# Patient Record
Sex: Female | Born: 2000 | Smoking: Never smoker
Health system: Southern US, Community
[De-identification: ages and names within clinical notes are randomized; demographics above are authoritative.]

## PROBLEM LIST (undated history)

## (undated) DIAGNOSIS — N946 Dysmenorrhea, unspecified: Secondary | ICD-10-CM

## (undated) DIAGNOSIS — J3089 Other allergic rhinitis: Secondary | ICD-10-CM

## (undated) HISTORY — DX: Dysmenorrhea, unspecified: N94.6

## (undated) HISTORY — DX: Other allergic rhinitis: J30.89

## (undated) HISTORY — PX: WISDOM TOOTH EXTRACTION: SHX21

---
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
## 2020-01-04 ENCOUNTER — Other Ambulatory Visit: Payer: Self-pay

## 2020-01-04 ENCOUNTER — Telehealth (HOSPITAL_COMMUNITY): Payer: Self-pay | Admitting: Licensed Clinical Social Worker

## 2020-01-04 ENCOUNTER — Ambulatory Visit (HOSPITAL_COMMUNITY): Payer: Medicaid Other | Admitting: Licensed Clinical Social Worker

## 2020-01-04 NOTE — Telephone Encounter (Signed)
Pt did not present for her virtual therapy session. Called and left vm for patient to join session. Pt did not join. Ana Hunter, LCAS

## 2020-01-13 ENCOUNTER — Other Ambulatory Visit: Payer: Self-pay

## 2020-01-13 ENCOUNTER — Telehealth (INDEPENDENT_AMBULATORY_CARE_PROVIDER_SITE_OTHER): Payer: Medicaid Other | Admitting: Psychiatry

## 2020-01-13 DIAGNOSIS — F3341 Major depressive disorder, recurrent, in partial remission: Secondary | ICD-10-CM | POA: Diagnosis not present

## 2020-01-13 DIAGNOSIS — F411 Generalized anxiety disorder: Secondary | ICD-10-CM

## 2020-01-13 DIAGNOSIS — F909 Attention-deficit hyperactivity disorder, unspecified type: Secondary | ICD-10-CM | POA: Insufficient documentation

## 2020-01-13 DIAGNOSIS — F410 Panic disorder [episodic paroxysmal anxiety] without agoraphobia: Secondary | ICD-10-CM | POA: Diagnosis not present

## 2020-01-13 MED ORDER — MIRTAZAPINE 15 MG PO TABS: 15.0000 mg | ORAL_TABLET | Freq: Every day | ORAL | 2 refills | Status: DC

## 2020-01-13 MED ORDER — DIAZEPAM 5 MG PO TABS: 5.0000 mg | ORAL_TABLET | Freq: Two times a day (BID) | ORAL | 1 refills | Status: DC | PRN

## 2020-01-13 MED ORDER — AMPHETAMINE-DEXTROAMPHET ER 20 MG PO CP24
20.0000 mg | ORAL_CAPSULE | ORAL | 0 refills | Status: DC
Start: 2020-03-13 — End: 2020-06-06

## 2020-01-13 MED ORDER — AMPHETAMINE-DEXTROAMPHET ER 20 MG PO CP24
20.0000 mg | ORAL_CAPSULE | ORAL | 0 refills | Status: DC
Start: 2020-01-13 — End: 2020-04-11

## 2020-01-13 MED ORDER — AMPHETAMINE-DEXTROAMPHET ER 20 MG PO CP24
20.0000 mg | ORAL_CAPSULE | ORAL | 0 refills | Status: DC
Start: 2020-02-12 — End: 2020-04-11

## 2020-01-13 MED ORDER — BUPROPION HCL ER (SR) 100 MG PO TB12: 100.0000 mg | ORAL_TABLET | Freq: Two times a day (BID) | ORAL | 0 refills | Status: DC

## 2020-01-13 NOTE — Progress Notes (Signed)
BH MD/PA/NP OP Progress Note  01/13/2020 10:42 AM Kaci Leylah Tarnow  MRN:  [id number] Interview was conducted by phone and I verified that I was speaking with the correct person using two identifiers. I discussed the limitations of evaluation and management by telemedicine and  the availability of in person appointments. Patient expressed understanding and agreed to proceed. Patient location - home; physician - home office.  Chief Complaint: Lack of focus, forgetfulness, morning sedation.  HPI: 19yo single female referred to our practice by her PCP for treatment of depression and anxiety. Arnette has a hx od depressed mood and excessive worrying. In the past she engaged in SIB by cutting to deal with high level of stress. She denies having any hx of suicidal thoughts however. She has not engaged in SIB for several monthsnow. 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 She has been clean for past 6 months. Ekaterini was in counseling at [facility] in the past. She has never been on any psychotropic medications for depression/anxiety.We attempted to start fluoxetine but she could not tolerate nausea and vomiting that this medication caused. Similarly trazodone prescribed for insomnia also made her feel nauseated and did not help with sleep.We have started mirtazapine and she tolerates it much better, less depressed, better sleep (she feels it is "too good" since dose was increased to 30 mg). Anxiety has not decreased much and she was panic like symptoms almost daily. We added Ambien prn sleep but it is not covered by her insurance.Lorazepam was not helpful for anxiety but diazepam is.She has been c/o daytime fatigue, lack of focus, forgetfulness.These symptoms are not new but seemed to have worsened in the past few years. We added Wellbutrin SR now 200 mg bid and while she tolerates it well it is of not much help. Her sister has ADD and is prescribed  Adderall xr 25 mg. Zita took a lower dose once and found it helpful.   Visit Diagnosis:    ICD-10-CM   1. Adult ADHD  F90.9   2. GAD (generalized anxiety disorder)  F41.1   3. Major depressive disorder, recurrent episode, in partial remission (HCC)  F33.41   4. Panic disorder  F41.0     Past Psychiatric History: Please see intake H&P.  Past Medical History:  Past Medical History:  Diagnosis Date  . Environmental and seasonal allergies   . Painful menstrual periods     Past Surgical History:  Procedure Laterality Date  . WISDOM TOOTH EXTRACTION      Family Psychiatric History: Reviewed.  Family History:  Family History  Problem Relation Age of Onset  . Cancer Maternal Grandmother        lung  . Alcohol abuse Father   . Alcohol abuse Paternal Grandfather   . ADD / ADHD Sister     Social History:  Social History   Socioeconomic History  . Marital status: Single    Spouse name: Not on file  . Number of children: 0  . Years of education: Not on file  . Highest education level: Not on file  Occupational History  . Occupation: Consulting civil engineer  Tobacco Use  . Smoking status: Never Smoker  . Smokeless tobacco: Never Used  Vaping Use  . Vaping Use: Some days  Substance and Sexual Activity  . Alcohol use: Never  . Drug use: Not Currently    Types: Marijuana    Comment: daily for 2 years until about 6  months ago  . Sexual activity: Never  Other Topics Concern  . Not on file  Social History Narrative  . Not on file   Social Determinants of Health   Financial Resource Strain:   . Difficulty of Paying Living Expenses: Not on file  Food Insecurity:   . Worried About Programme researcher, broadcasting/film/video in the Last Year: Not on file  . Ran Out of Food in the Last Year: Not on file  Transportation Needs:   . Lack of Transportation (Medical): Not on file  . Lack of Transportation (Non-Medical): Not on file  Physical Activity:   . Days of Exercise per Week: Not on file  . Minutes of  Exercise per Session: Not on file  Stress:   . Feeling of Stress : Not on file  Social Connections:   . Frequency of Communication with Friends and Family: Not on file  . Frequency of Social Gatherings with Friends and Family: Not on file  . Attends Religious Services: Not on file  . Active Member of Clubs or Organizations: Not on file  . Attends Banker Meetings: Not on file  . Marital Status: Not on file    Allergies: No Known Allergies  Metabolic Disorder Labs: Lab Results  Component Value Date   HGBA1C 5.0 08/02/2019   MPG 96.8 08/02/2019   MPG 102.54 12/02/2017   No results found for: PROLACTIN Lab Results  Component Value Date   CHOL 177 (H) 08/02/2019   TRIG 64 08/02/2019   HDL 72 08/02/2019   CHOLHDL 2.5 08/02/2019   VLDL 13 08/02/2019   LDLCALC 92 08/02/2019   LDLCALC 79 12/02/2017   Lab Results  Component Value Date   TSH 0.501 12/02/2017    Therapeutic Level Labs: No results found for: LITHIUM No results found for: VALPROATE No components found for:  CBMZ  Current Medications: Current Outpatient Medications  Medication Sig Dispense Refill  . amphetamine-dextroamphetamine (ADDERALL XR) 20 MG 24 hr capsule Take 1 capsule (20 mg total) by mouth every morning. 30 capsule 0  . [START ON 02/12/2020] amphetamine-dextroamphetamine (ADDERALL XR) 20 MG 24 hr capsule Take 1 capsule (20 mg total) by mouth every morning. 30 capsule 0  . [START ON 03/13/2020] amphetamine-dextroamphetamine (ADDERALL XR) 20 MG 24 hr capsule Take 1 capsule (20 mg total) by mouth every morning. 30 capsule 0  . buPROPion (WELLBUTRIN SR) 100 MG 12 hr tablet Take 1 tablet (100 mg total) by mouth 2 (two) times daily for 10 days. One tablet in AM, one around 3 PM 20 tablet 0  . diazepam (VALIUM) 5 MG tablet Take 1 tablet (5 mg total) by mouth 2 (two) times daily as needed for anxiety. 60 tablet 1  . mirtazapine (REMERON) 15 MG tablet Take 1 tablet (15 mg total) by mouth at bedtime.  30 tablet 2   No current facility-administered medications for this visit.      Psychiatric Specialty Exam: Review of Systems  Constitutional: Positive for fatigue.  Psychiatric/Behavioral: Positive for decreased concentration. The patient is nervous/anxious.   All other systems reviewed and are negative.   There were no vitals taken for this visit.There is no height or weight on file to calculate BMI.  General Appearance: NA  Eye Contact:  NA  Speech:  Clear and Coherent and Normal Rate  Volume:  Normal  Mood:  Anxious  Affect:  NA  Thought Process:  Goal Directed  Orientation:  Full (Time, Place, and Person)  Thought Content: Logical  Suicidal Thoughts:  No  Homicidal Thoughts:  No  Memory:  Immediate;   Fair Recent;   Fair Remote;   Good  Judgement:  Good  Insight:  Good  Psychomotor Activity:  NA  Concentration:  Concentration: Fair  Recall:  Fair  Fund of Knowledge: Good  Language: Good  Akathisia:  Negative  Handed:  Right  AIMS (if indicated): not done  Assets:  Communication Skills Desire for Improvement Housing Physical Health Talents/Skills  ADL's:  Intact  Cognition: WNL  Sleep:  Good    Assessment and Plan: 19yo single female referred to our practice by her PCP for treatment of depression and anxiety. Danayah has a hx od depressed mood and excessive worrying. In the past she engaged in SIB by cutting to deal with high level of stress. She denies having any hx of suicidal thoughts however. She has not engaged in SIB for several monthsnow. 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 She has been clean for past 6 months. Khianna was in counseling at [facility] in the past. She has never been on any psychotropic medications for depression/anxiety.We attempted to start fluoxetine but she could not tolerate nausea and vomiting that this medication caused. Similarly trazodone prescribed for insomnia also made  her feel nauseated and did not help with sleep.We have started mirtazapine and she tolerates it much better, less depressed, better sleep (she feels it is "too good" since dose was increased to 30 mg). Anxiety has not decreased much and she was panic like symptoms almost daily. We added Ambien prn sleep but it is not covered by her insurance.Lorazepam was not helpful for anxiety but diazepam is.She has been c/o daytime fatigue, lack of focus, forgetfulness.These symptoms are not new but seemed to have worsened in the past few years. We added Wellbutrin SR now 200 mg bid and while she tolerates it well it is of not much help. Her older sister has ADD and is prescribed Adderall xr 25 mg. Chasta took a lower dose once and found it helpful.  Dx: MDD recurrent in partial remission; GAD/Panic disorder; Adult ADD  Plan: We willcontinuemirtazapinebut decrease dose to 15 mg at hs and diazepam 5 mg bid for panic attacks.I will decrese Wellbutrin SR to 100 mg bid for 10 days then stop. We will start Adderall XR 20 mg daily. Next appointment in4 weeks.The plan was discussed with patient who had an opportunity to ask questions and these were all answered. I spend46minutes inphone consultationwith the patient.    Magdalene Patricia, MD 01/13/2020, 10:42 AM

## 2020-02-18 ENCOUNTER — Other Ambulatory Visit: Payer: Self-pay

## 2020-02-18 ENCOUNTER — Telehealth (INDEPENDENT_AMBULATORY_CARE_PROVIDER_SITE_OTHER): Payer: Medicaid Other | Admitting: Psychiatry

## 2020-02-18 DIAGNOSIS — F3341 Major depressive disorder, recurrent, in partial remission: Secondary | ICD-10-CM | POA: Diagnosis not present

## 2020-02-18 DIAGNOSIS — F909 Attention-deficit hyperactivity disorder, unspecified type: Secondary | ICD-10-CM

## 2020-02-18 DIAGNOSIS — F411 Generalized anxiety disorder: Secondary | ICD-10-CM | POA: Diagnosis not present

## 2020-02-18 DIAGNOSIS — F410 Panic disorder [episodic paroxysmal anxiety] without agoraphobia: Secondary | ICD-10-CM | POA: Diagnosis not present

## 2020-02-18 MED ORDER — DIAZEPAM 5 MG PO TABS: 5.0000 mg | ORAL_TABLET | Freq: Two times a day (BID) | ORAL | 1 refills | Status: DC | PRN

## 2020-02-18 NOTE — Progress Notes (Signed)
BH MD/PA/NP OP Progress Note  02/18/2020 10:42 AM Ana Hunter  MRN:  [id number] Interview was conducted by phone and I verified that I was speaking with the correct person using two identifiers. I discussed the limitations of evaluation and management by telemedicine and  the availability of in person appointments. Patient expressed understanding and agreed to proceed. Patient location - home; physician - home office.  Chief Complaint: Anxiety.  HPI: 19yo single female referred to our practice by her PCP for treatment of depression and anxiety. Ana Hunter has a hx od depressed mood and excessive worrying. In the past she engaged in SIB by cutting to deal with high level of stress. She denies having any hx of suicidal thoughts however. She has not engaged in SIB for several monthsnow. 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 She has been clean for past 6 months. Ana Hunter was in counseling at [facility] in the past. She has never been on any psychotropic medications for depression/anxiety.We attempted to start fluoxetine but she could not tolerate nausea and vomiting that this medication caused. Similarly trazodone prescribed for insomnia also made her feel nauseated and did not help with sleep.We have started mirtazapine and she tolerates it much better, less depressed, better sleep (she feels it is "too good" since dose was increased to 30 mg). Anxiety has not decreased much and she was panic like symptoms almost daily. We added Ambien prn sleep but it is not covered by her insurance.Lorazepam was not helpful for anxiety but diazepam is.She has been c/o daytime fatigue, lack of focus, forgetfulness.These symptoms are not new but seemed to have worsened in the past few years. We added Wellbutrin SR now 200 mg bid and while she tolerates it well it is of not much help. Her older sister has ADD and is prescribed Adderall xr 25 mg. Ana Hunter took a  lower dose once and found it helpful. We have dc Wellbutrin and added Adderall  XR 20 mg. She reports feeling "much better", no fatigue, no problems with focusing, no depression, good sleep. Panic attacks continue on occasion though.   Visit Diagnosis:    ICD-10-CM   1. Adult ADHD  F90.9   2. GAD (generalized anxiety disorder)  F41.1   3. Major depressive disorder, recurrent episode, in partial remission (HCC)  F33.41   4. Panic disorder  F41.0     Past Psychiatric History: Please see intake H&P.  Past Medical History:  Past Medical History:  Diagnosis Date  . Environmental and seasonal allergies   . Painful menstrual periods     Past Surgical History:  Procedure Laterality Date  . WISDOM TOOTH EXTRACTION      Family Psychiatric History: Reviewed.  Family History:  Family History  Problem Relation Age of Onset  . Cancer Maternal Grandmother        lung  . Alcohol abuse Father   . Alcohol abuse Paternal Grandfather   . ADD / ADHD Sister     Social History:  Social History   Socioeconomic History  . Marital status: Single    Spouse name: Not on file  . Number of children: 0  . Years of education: Not on file  . Highest education level: Not on file  Occupational History  . Occupation: Consulting civil engineer  Tobacco Use  . Smoking status: Never Smoker  . Smokeless tobacco: Never Used  Vaping Use  . Vaping Use: Some days  Substance and Sexual  Activity  . Alcohol use: Never  . Drug use: Not Currently    Types: Marijuana    Comment: daily for 2 years until about 6 months ago  . Sexual activity: Never  Other Topics Concern  . Not on file  Social History Narrative  . Not on file   Social Determinants of Health   Financial Resource Strain:   . Difficulty of Paying Living Expenses: Not on file  Food Insecurity:   . Worried About Programme researcher, broadcasting/film/video in the Last Year: Not on file  . Ran Out of Food in the Last Year: Not on file  Transportation Needs:   . Lack of  Transportation (Medical): Not on file  . Lack of Transportation (Non-Medical): Not on file  Physical Activity:   . Days of Exercise per Week: Not on file  . Minutes of Exercise per Session: Not on file  Stress:   . Feeling of Stress : Not on file  Social Connections:   . Frequency of Communication with Friends and Family: Not on file  . Frequency of Social Gatherings with Friends and Family: Not on file  . Attends Religious Services: Not on file  . Active Member of Clubs or Organizations: Not on file  . Attends Banker Meetings: Not on file  . Marital Status: Not on file    Allergies: No Known Allergies  Metabolic Disorder Labs: Lab Results  Component Value Date   HGBA1C 5.0 08/02/2019   MPG 96.8 08/02/2019   MPG 102.54 12/02/2017   No results found for: PROLACTIN Lab Results  Component Value Date   CHOL 177 (H) 08/02/2019   TRIG 64 08/02/2019   HDL 72 08/02/2019   CHOLHDL 2.5 08/02/2019   VLDL 13 08/02/2019   LDLCALC 92 08/02/2019   LDLCALC 79 12/02/2017   Lab Results  Component Value Date   TSH 0.501 12/02/2017    Therapeutic Level Labs: No results found for: LITHIUM No results found for: VALPROATE No components found for:  CBMZ  Current Medications: Current Outpatient Medications  Medication Sig Dispense Refill  . amphetamine-dextroamphetamine (ADDERALL XR) 20 MG 24 hr capsule Take 1 capsule (20 mg total) by mouth every morning. 30 capsule 0  . amphetamine-dextroamphetamine (ADDERALL XR) 20 MG 24 hr capsule Take 1 capsule (20 mg total) by mouth every morning. 30 capsule 0  . [START ON 03/13/2020] amphetamine-dextroamphetamine (ADDERALL XR) 20 MG 24 hr capsule Take 1 capsule (20 mg total) by mouth every morning. 30 capsule 0  . [START ON 03/14/2020] diazepam (VALIUM) 5 MG tablet Take 1 tablet (5 mg total) by mouth 2 (two) times daily as needed for anxiety. 60 tablet 1  . mirtazapine (REMERON) 15 MG tablet Take 1 tablet (15 mg total) by mouth at  bedtime. 30 tablet 2   No current facility-administered medications for this visit.      Psychiatric Specialty Exam: Review of Systems  Psychiatric/Behavioral: Positive for decreased concentration. The patient is nervous/anxious.   All other systems reviewed and are negative.   There were no vitals taken for this visit.There is no height or weight on file to calculate BMI.  General Appearance: NA  Eye Contact:  NA  Speech:  Clear and Coherent and Normal Rate  Volume:  Normal  Mood:  Anxious  Affect:  NA  Thought Process:  Goal Directed and Linear  Orientation:  Full (Time, Place, and Person)  Thought Content: Logical   Suicidal Thoughts:  No  Homicidal Thoughts:  No  Memory:  Immediate;   Good Recent;   Good Remote;   Good  Judgement:  Good  Insight:  Good  Psychomotor Activity:  NA  Concentration:  Concentration: Good  Recall:  Good  Fund of Knowledge: Good  Language: Good  Akathisia:  Negative  Handed:  Right  AIMS (if indicated): not done  Assets:  Communication Skills Desire for Improvement Housing Physical Health Talents/Skills  ADL's:  Intact  Cognition: WNL  Sleep:  Good    Assessment and Plan: 19yo single female referred to our practice by her PCP for treatment of depression and anxiety. Ana Hunter has a hx od depressed mood and excessive worrying. In the past she engaged in SIB by cutting to deal with high level of stress. She denies having any hx of suicidal thoughts however. She has not engaged in SIB for several monthsnow. 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 She has been clean for past 6 months. Ana Hunter was in counseling at [facility] in the past. She has never been on any psychotropic medications for depression/anxiety.We attempted to start fluoxetine but she could not tolerate nausea and vomiting that this medication caused. Similarly trazodone prescribed for insomnia also made her feel nauseated  and did not help with sleep.We have started mirtazapine and she tolerates it much better, less depressed, better sleep (she feels it is "too good" since dose was increased to 30 mg). Anxiety has not decreased much and she was panic like symptoms almost daily. We added Ambien prn sleep but it is not covered by her insurance.Lorazepam was not helpful for anxiety but diazepam is.She has been c/o daytime fatigue, lack of focus, forgetfulness.These symptoms are not new but seemed to have worsened in the past few years. We added Wellbutrin SR now 200 mg bid and while she tolerates it well it is of not much help. Her older sister has ADD and is prescribed Adderall xr 25 mg. Ana Hunter took a lower dose once and found it helpful. We have dc Wellbutrin and added Adderall  XR 20 mg. She reports feeling "much better", no fatigue, no problems with focusing, no depression, good sleep. Panic attacks continue on occasion though.  Dx: MDD recurrent in partial remission; GAD/Panic disorder; Adult ADD  Plan: We willcontinuemirtazapine15 mg at hs, diazepam 5 mg bid for panic attacks and Adderall XR 20 mg daily. Next appointment in7 weeks.The plan was discussed with patient who had an opportunity to ask questions and these were all answered. I spend19minutes inphone consultationwith the patient.    Magdalene Patricia, MD 02/18/2020, 10:42 AM

## 2020-04-11 ENCOUNTER — Telehealth (INDEPENDENT_AMBULATORY_CARE_PROVIDER_SITE_OTHER): Payer: Medicaid Other | Admitting: Psychiatry

## 2020-04-11 ENCOUNTER — Other Ambulatory Visit: Payer: Self-pay

## 2020-04-11 DIAGNOSIS — F909 Attention-deficit hyperactivity disorder, unspecified type: Secondary | ICD-10-CM

## 2020-04-11 DIAGNOSIS — F410 Panic disorder [episodic paroxysmal anxiety] without agoraphobia: Secondary | ICD-10-CM

## 2020-04-11 DIAGNOSIS — F411 Generalized anxiety disorder: Secondary | ICD-10-CM

## 2020-04-11 DIAGNOSIS — F3341 Major depressive disorder, recurrent, in partial remission: Secondary | ICD-10-CM | POA: Diagnosis not present

## 2020-04-11 MED ORDER — CLONAZEPAM 1 MG PO TABS
1.0000 mg | ORAL_TABLET | Freq: Two times a day (BID) | ORAL | 1 refills | Status: DC | PRN
Start: 2020-04-11 — End: 2020-04-26

## 2020-04-11 MED ORDER — AMPHETAMINE-DEXTROAMPHET ER 20 MG PO CP24: 20.0000 mg | ORAL_CAPSULE | ORAL | 0 refills | Status: DC

## 2020-04-11 MED ORDER — ZOLPIDEM TARTRATE 5 MG PO TABS: 5.0000 mg | ORAL_TABLET | Freq: Every evening | ORAL | 1 refills | Status: DC | PRN

## 2020-04-11 MED ORDER — AMPHETAMINE-DEXTROAMPHET ER 20 MG PO CP24
20.0000 mg | ORAL_CAPSULE | ORAL | 0 refills | Status: DC
Start: 2020-04-18 — End: 2020-06-06

## 2020-04-11 NOTE — Progress Notes (Signed)
BH MD/PA/NP OP Progress Note  04/11/2020 9:14 AM Ana Hunter  MRN:  [id number] Interview was conducted by phone and I verified that I was speaking with the correct person using two identifiers. I discussed the limitations of evaluation and management by telemedicine and  the availability of in person appointments. Patient expressed understanding and agreed to proceed. Patient location - home; physician - home office.  Chief Complaint: Anxiety.  HPI: 19yo single female referred to our practice by her PCP for treatment of depression and anxiety. Ana Hunter has a hx od depressed mood and excessive worrying. In the past she engaged in SIB by cutting to deal with high level of stress. She denies having any hx of suicidal thoughts however. She has not engaged in SIB for several monthsnow. 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 She has been clean for past 6 months. Ana Hunter was in counseling at [facility] in the past. She has never been on any psychotropic medications for depression/anxiety.We attempted to start fluoxetine but she could not tolerate nausea and vomiting that this medication caused. Similarly trazodone prescribed for insomnia also made her feel nauseated and did not help with sleep.We have started mirtazapine and she tolerates it much better, less depressed, better sleep(she feels it is "too good" since dose was increased to 30 mg). Anxiety has not decreased much and shewas panic like symptoms almost daily. We added Ambien prn sleep but it is not covered by her insurance.Lorazepam was not helpful for anxiety but diazepam is.She has been c/o daytime fatigue,lack of focus,forgetfulness.These symptoms are not new but seemed to have worsened in the past few years.We added Wellbutrin SRnow 200 mg bidand while she tolerates it well it is of not much help. Her oldersister hasADD and is prescribed Adderall xr 25 mg. Ana Hunter took a  lower dose once and found it helpful. We have dc Wellbutrin and added Adderall  XR 20 mg. She reports feeling "much better", no fatigue, no problems with focusing, no depression, good sleep. Panic attacks continue on occasion though and diazepam 5 mg is of limited benefit (she odes not need it every day though). Also she feels irritable in AM after she takes mirtazapine and would like to try a different sleep aid (failed trazodone earlier).    Visit Diagnosis:    ICD-10-CM   1. Adult ADHD  F90.9   2. GAD (generalized anxiety disorder)  F41.1   3. Major depressive disorder, recurrent episode, in partial remission (HCC)  F33.41   4. Panic disorder  F41.0     Past Psychiatric History: Please see intake H&P>  Past Medical History:  Past Medical History:  Diagnosis Date  . Environmental and seasonal allergies   . Painful menstrual periods     Past Surgical History:  Procedure Laterality Date  . WISDOM TOOTH EXTRACTION      Family Psychiatric History: Reviewed.  Family History:  Family History  Problem Relation Age of Onset  . Cancer Maternal Grandmother        lung  . Alcohol abuse Father   . Alcohol abuse Paternal Grandfather   . ADD / ADHD Sister     Social History:  Social History   Socioeconomic History  . Marital status: Single    Spouse name: Not on file  . Number of children: 0  . Years of education: Not on file  . Highest education level: Not on file  Occupational History  .  Occupation: Consulting civil engineer  Tobacco Use  . Smoking status: Never Smoker  . Smokeless tobacco: Never Used  Vaping Use  . Vaping Use: Some days  Substance and Sexual Activity  . Alcohol use: Never  . Drug use: Not Currently    Types: Marijuana    Comment: daily for 2 years until about 6 months ago  . Sexual activity: Never  Other Topics Concern  . Not on file  Social History Narrative  . Not on file   Social Determinants of Health   Financial Resource Strain:   . Difficulty of Paying  Living Expenses: Not on file  Food Insecurity:   . Worried About Programme researcher, broadcasting/film/video in the Last Year: Not on file  . Ran Out of Food in the Last Year: Not on file  Transportation Needs:   . Lack of Transportation (Medical): Not on file  . Lack of Transportation (Non-Medical): Not on file  Physical Activity:   . Days of Exercise per Week: Not on file  . Minutes of Exercise per Session: Not on file  Stress:   . Feeling of Stress : Not on file  Social Connections:   . Frequency of Communication with Friends and Family: Not on file  . Frequency of Social Gatherings with Friends and Family: Not on file  . Attends Religious Services: Not on file  . Active Member of Clubs or Organizations: Not on file  . Attends Banker Meetings: Not on file  . Marital Status: Not on file    Allergies: No Known Allergies  Metabolic Disorder Labs: Lab Results  Component Value Date   HGBA1C 5.0 08/02/2019   MPG 96.8 08/02/2019   MPG 102.54 12/02/2017   No results found for: PROLACTIN Lab Results  Component Value Date   CHOL 177 (H) 08/02/2019   TRIG 64 08/02/2019   HDL 72 08/02/2019   CHOLHDL 2.5 08/02/2019   VLDL 13 08/02/2019   LDLCALC 92 08/02/2019   LDLCALC 79 12/02/2017   Lab Results  Component Value Date   TSH 0.501 12/02/2017    Therapeutic Level Labs: No results found for: LITHIUM No results found for: VALPROATE No components found for:  CBMZ  Current Medications: Current Outpatient Medications  Medication Sig Dispense Refill  . amphetamine-dextroamphetamine (ADDERALL XR) 20 MG 24 hr capsule Take 1 capsule (20 mg total) by mouth every morning. 30 capsule 0  . [START ON 05/18/2020] amphetamine-dextroamphetamine (ADDERALL XR) 20 MG 24 hr capsule Take 1 capsule (20 mg total) by mouth every morning. 30 capsule 0  . [START ON 04/18/2020] amphetamine-dextroamphetamine (ADDERALL XR) 20 MG 24 hr capsule Take 1 capsule (20 mg total) by mouth every morning. 30 capsule 0  .  clonazePAM (KLONOPIN) 1 MG tablet Take 1 tablet (1 mg total) by mouth 2 (two) times daily as needed for anxiety. 60 tablet 1  . zolpidem (AMBIEN) 5 MG tablet Take 1 tablet (5 mg total) by mouth at bedtime as needed for sleep. 30 tablet 1   No current facility-administered medications for this visit.     Psychiatric Specialty Exam: Review of Systems  Psychiatric/Behavioral: The patient is nervous/anxious.   All other systems reviewed and are negative.   There were no vitals taken for this visit.There is no height or weight on file to calculate BMI.  General Appearance: NA  Eye Contact:  NA  Speech:  Clear and Coherent and Normal Rate  Volume:  Normal  Mood:  Anxious  Affect:  NA  Thought  Process:  Goal Directed and Linear  Orientation:  Full (Time, Place, and Person)  Thought Content: Logical   Suicidal Thoughts:  No  Homicidal Thoughts:  No  Memory:  Immediate;   Good Recent;   Good Remote;   Good  Judgement:  Good  Insight:  Good  Psychomotor Activity:  NA  Concentration:  Concentration: Good  Recall:  Good  Fund of Knowledge: Good  Language: Good  Akathisia:  Negative  Handed:  Right  AIMS (if indicated): not done  Assets:  Communication Skills Desire for Improvement Financial Resources/Insurance Housing Physical Health Resilience  ADL's:  Intact  Cognition: WNL  Sleep:  Fair     Assessment and Plan: 19yo single female with hx od depressed mood and excessive worrying. In the past she engaged in SIB by cutting to deal with high level of stress. She denies having any hx of suicidal thoughts however. She has not engaged in SIB for several monthsnow. 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 She has been clean for past 6 months. Ana Hunter was in counseling at [facility] in the past. She has never been on any psychotropic medications for depression/anxiety.We attempted to start fluoxetine but she could not tolerate  nausea and vomiting that this medication caused. Similarly trazodone prescribed for insomnia also made her feel nauseated and did not help with sleep.We have started mirtazapine and she tolerates it much better, less depressed, better sleep(she feels it is "too good" since dose was increased to 30 mg). Anxiety has not decreased much and shewas having panic like symptoms almost daily. Lorazepam was not helpful for anxiety but diazepam is.She has been c/o daytime fatigue,lack of focus,forgetfulness.These symptoms are not new but seemed to have worsened in the past few years.We added Wellbutrin SRnow 200 mg bidand while she tolerates it well it is of not much help. Her oldersister hasADD and is prescribed Adderall xr 25 mg. Ana Hunter took a lower dose once and found it helpful. We have dc Wellbutrin and added Adderall  XR 20 mg. She reports feeling "much better", no fatigue, no problems with focusing, no depression, good sleep. Panic attacks continue on occasion though and diazepam 5 mg is of limited benefit (she odes not need it every day though). Also she feels irritable in AM after she takes mirtazapine and would like to try a different sleep aid (failed trazodone earlier).  Dx:  GAD/Panic disorder;AdultADD; MDD recurrentin partial remission  Plan: We willdiscontinuemirtazapine15 mgat hs, diazepam 5 mg bidfor panic attacks and insted try zolpidem 5 mg for insomnia nd clonazepam 1 mg prn panic attacks. AdderallXR 20 mg daily will be continued.Next appointment in7 weeks.The plan was discussed with patient who had an opportunity to ask questions and these were all answered. I spend62minutes inphone consultationwith the patient.   Magdalene Patricia, MD 04/11/2020, 9:14 AM

## 2020-04-12 ENCOUNTER — Telehealth (HOSPITAL_COMMUNITY): Payer: Self-pay | Admitting: *Deleted

## 2020-04-12 NOTE — Telephone Encounter (Signed)
Made call to pharmacy to determine if prior authorizations were needed for Klonopin or Adderall. Pharmacy stated there was no need for a PA on either med. Pharmacy reported patients Medicaid number as :[id number] Q.  Returned call to patient to advise that no prior authorization was needed.  Pharmacy stated it was to early to fill Adderall.

## 2020-04-26 ENCOUNTER — Telehealth (HOSPITAL_COMMUNITY): Payer: Medicaid Other | Admitting: Psychiatry

## 2020-04-26 ENCOUNTER — Telehealth (HOSPITAL_COMMUNITY): Payer: Self-pay | Admitting: *Deleted

## 2020-04-26 ENCOUNTER — Other Ambulatory Visit (HOSPITAL_COMMUNITY): Payer: Self-pay | Admitting: Psychiatry

## 2020-04-26 MED ORDER — ALPRAZOLAM 1 MG PO TABS: 1.0000 mg | ORAL_TABLET | Freq: Two times a day (BID) | ORAL | 0 refills | Status: DC | PRN

## 2020-04-26 NOTE — Telephone Encounter (Signed)
I will let her know

## 2020-04-26 NOTE — Telephone Encounter (Signed)
Patient called to report that the Klonopin and Ambien were not helping her.  She reports she continues to be highly anxious.  Please review.

## 2020-04-26 NOTE — Telephone Encounter (Signed)
This is the third benzodiazepine that does not help with panic attacks? I ordered alprazolam this time but if it does not help either I am not going to try another benzo. I also suggest she tries to double dose of Ambien to 10 mg and let us know if sleep is better. She has appointment with me in a month I believe.

## 2020-04-30 ENCOUNTER — Other Ambulatory Visit (HOSPITAL_COMMUNITY): Payer: Self-pay | Admitting: Psychiatry

## 2020-06-06 ENCOUNTER — Other Ambulatory Visit: Payer: Self-pay

## 2020-06-06 ENCOUNTER — Telehealth (INDEPENDENT_AMBULATORY_CARE_PROVIDER_SITE_OTHER): Payer: Medicaid Other | Admitting: Psychiatry

## 2020-06-06 DIAGNOSIS — F411 Generalized anxiety disorder: Secondary | ICD-10-CM | POA: Diagnosis not present

## 2020-06-06 DIAGNOSIS — F909 Attention-deficit hyperactivity disorder, unspecified type: Secondary | ICD-10-CM | POA: Diagnosis not present

## 2020-06-06 DIAGNOSIS — F410 Panic disorder [episodic paroxysmal anxiety] without agoraphobia: Secondary | ICD-10-CM

## 2020-06-06 DIAGNOSIS — F3341 Major depressive disorder, recurrent, in partial remission: Secondary | ICD-10-CM | POA: Diagnosis not present

## 2020-06-06 MED ORDER — DIAZEPAM 5 MG PO TABS: 5.0000 mg | ORAL_TABLET | Freq: Three times a day (TID) | ORAL | 1 refills | Status: AC | PRN

## 2020-06-06 MED ORDER — ATOMOXETINE HCL 40 MG PO CAPS: ORAL_CAPSULE | ORAL | 0 refills | Status: DC

## 2020-06-06 MED ORDER — DOXEPIN HCL 10 MG PO CAPS: 10.0000 mg | ORAL_CAPSULE | Freq: Every day | ORAL | 1 refills | Status: DC

## 2020-06-06 NOTE — Progress Notes (Signed)
BH MD/PA/NP OP Progress Note  06/06/2020 10:24 AM Ana Hunter  MRN:  [id number] Interview was conducted by phone and I verified that I was speaking with the correct person using two identifiers. I discussed the limitations of evaluation and management by telemedicine and  the availability of in person appointments. Patient expressed understanding and agreed to proceed. Participants in the visit: patient (location - home); physician (location - home office).  Chief Complaint: Anxiety, insomnia, weight loss.  HPI: 20yo single female with hx od depressed mood and excessive worrying. In the past she engaged in SIB by cutting to deal with high level of stress. She denies having any hx of suicidal thoughts however. She has not engaged in SIB for several monthsnow. 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 She has been clean for past 6 months. Ana Hunter was in counseling at [facility] in the past. She has never been on any psychotropic medications for depression/anxiety.We attempted to start fluoxetine but she could not tolerate nausea and vomiting that this medication caused. Similarly trazodone prescribed for insomnia also made her feel nauseated and did not help with sleep.We have started mirtazapine and she tolerates it much better, less depressed, better sleep(she feels it is "too good" since dose was increased to 30 mg). Anxiety has not decreased much and shewas having panic like symptoms almost daily.She has been c/o daytime fatigue,lack of focus,forgetfulness.These symptoms are not new but seemed to have worsened in the past few years.We added Wellbutrin SRnow 200 mg bidand while she tolerates it well it is of not much help. Her oldersister hasADD and is prescribed Adderall xr 25 mg. Ana Hunter took a lower dose once and found it helpful.We have dc Wellbutrin and added Adderall XR 20 mg. She reports feeling "much better", no fatigue,  no problems with focusing, no depression, good sleep. Unfortunately she has lost almost 30 lbs on it and decided to stop it. Panic attacks continue on occasion - she has tried lorazepam, diazepam, clonazepam and alprazolam. She feels that diazepam has been most effective. Sleep also remains a problem. She has failed trazodone, mirtazapine made her feel irritable and boosted appetite, zolpidem has not been helpful even when she took 15 mg of it. She has stopped taking all meds at this time but would like to try alternative ones.    Visit Diagnosis:    ICD-10-CM   1. Panic disorder  F41.0   2. GAD (generalized anxiety disorder)  F41.1   3. Adult ADHD  F90.9   4. Major depressive disorder, recurrent episode, in partial remission (HCC)  F33.41     Past Psychiatric History: Please see intake H&P.  Past Medical History:  Past Medical History:  Diagnosis Date  . Environmental and seasonal allergies   . Painful menstrual periods     Past Surgical History:  Procedure Laterality Date  . WISDOM TOOTH EXTRACTION      Family Psychiatric History: Reviewed.  Family History:  Family History  Problem Relation Age of Onset  . Cancer Maternal Grandmother        lung  . Alcohol abuse Father   . Alcohol abuse Paternal Grandfather   . ADD / ADHD Sister     Social History:  Social History   Socioeconomic History  . Marital status: Single    Spouse name: Not on file  . Number of children: 0  . Years of education: Not on file  . Highest education  level: Not on file  Occupational History  . Occupation: Consulting civil engineer  Tobacco Use  . Smoking status: Never Smoker  . Smokeless tobacco: Never Used  Vaping Use  . Vaping Use: Some days  Substance and Sexual Activity  . Alcohol use: Never  . Drug use: Not Currently    Types: Marijuana    Comment: daily for 2 years until about 6 months ago  . Sexual activity: Never  Other Topics Concern  . Not on file  Social History Narrative  . Not on file    Social Determinants of Health   Financial Resource Strain: Not on file  Food Insecurity: Not on file  Transportation Needs: Not on file  Physical Activity: Not on file  Stress: Not on file  Social Connections: Not on file    Allergies: No Known Allergies  Metabolic Disorder Labs: Lab Results  Component Value Date   HGBA1C 5.0 08/02/2019   MPG 96.8 08/02/2019   MPG 102.54 12/02/2017   No results found for: PROLACTIN Lab Results  Component Value Date   CHOL 177 (H) 08/02/2019   TRIG 64 08/02/2019   HDL 72 08/02/2019   CHOLHDL 2.5 08/02/2019   VLDL 13 08/02/2019   LDLCALC 92 08/02/2019   LDLCALC 79 12/02/2017   Lab Results  Component Value Date   TSH 0.501 12/02/2017    Therapeutic Level Labs: No results found for: LITHIUM No results found for: VALPROATE No components found for:  CBMZ  Current Medications: Current Outpatient Medications  Medication Sig Dispense Refill  . atomoxetine (STRATTERA) 40 MG capsule Take 1 capsule (40 mg total) by mouth daily for 7 days, THEN 2 capsules (80 mg total) daily. 67 capsule 0  . diazepam (VALIUM) 5 MG tablet Take 1 tablet (5 mg total) by mouth every 8 (eight) hours as needed for anxiety. 90 tablet 1  . doxepin (SINEQUAN) 10 MG capsule Take 1 capsule (10 mg total) by mouth at bedtime. 30 capsule 1   No current facility-administered medications for this visit.     Psychiatric Specialty Exam: Review of Systems  Constitutional: Positive for appetite change.  Psychiatric/Behavioral: Positive for decreased concentration and sleep disturbance. The patient is nervous/anxious.   All other systems reviewed and are negative.   There were no vitals taken for this visit.There is no height or weight on file to calculate BMI.  General Appearance: NA  Eye Contact:  NA  Speech:  Clear and Coherent and Normal Rate  Volume:  Normal  Mood:  Anxious  Affect:  NA  Thought Process:  Goal Directed  Orientation:  Full (Time, Place, and  Person)  Thought Content: Logical   Suicidal Thoughts:  No  Homicidal Thoughts:  No  Memory:  Immediate;   Good Recent;   Good Remote;   Good  Judgement:  Good  Insight:  Fair  Psychomotor Activity:  NA  Concentration:  Concentration: Fair  Recall:  Good  Fund of Knowledge: Good  Language: Good  Akathisia:  Negative  Handed:  Right  AIMS (if indicated): not done  Assets:  Communication Skills Desire for Improvement  ADL's:  Intact  Cognition: WNL  Sleep:  Poor    Assessment and Plan: 19yo single female with hx od depressed mood and excessive worrying. In the past she engaged in SIB by cutting to deal with high level of stress. She denies having any hx of suicidal thoughts however. She has not engaged in SIB for several monthsnow. She also used to smoke marijuana  daily for about two years as it decreased her anxiety level. She has been clean for past 6 months. Tahliyah was in counseling at [facility] in the past. She has never been on any psychotropic medications for depression/anxiety.We attempted to start fluoxetine but she could not tolerate nausea and vomiting that this medication caused. Similarly trazodone prescribed for insomnia also made her feel nauseated and did not help with sleep.We have started mirtazapine and she tolerates it much better, less depressed, better sleep(she feels it is "too good" since dose was increased to 30 mg). Anxiety has not decreased much and shewas having panic like symptoms almost daily.She has been c/o daytime fatigue,lack of focus,forgetfulness.These symptoms are not new but seemed to have worsened in the past few years.We added Wellbutrin SRnow 200 mg bidand while she tolerates it well it is of not much help. Her oldersister hasADD and is prescribed Adderall xr 25 mg. Declan took a lower dose once and found it helpful.We have dc Wellbutrin and added Adderall XR 20 mg. She reports feeling "much better", no fatigue, no problems with  focusing, no depression, good sleep. Unfortunately she has lost almost 30 lbs on it and decided to stop it. Panic attacks continue on occasion - she has tried lorazepam, diazepam, clonazepam and alprazolam. She feels that diazepam has been most effective. Sleep also remains a problem. She has failed trazodone, mirtazapine made her feel irritable and boosted appetite, zolpidem has not been helpful even when she took 15 mg of it. She has stopped taking all meds at this time but would like to try alternative ones.  Dx:  GAD/Panic disorder;AdultADD; MDD recurrentin partial remission  Plan: We willtry atomoxetine foe ADHD : 40 mg for a week then 80 mg daily. We will resume diazepam 5 mg prn panic attacks and try doxepin 10 mg for insomnia. Next appointment in5weeks.The plan was discussed with patient who had an opportunity to ask questions and these were all answered. I spend52minutes inphone consultationwith the patient.   Magdalene Patricia, MD 06/06/2020, 10:24 AM

## 2020-06-15 DIAGNOSIS — Z20822 Contact with and (suspected) exposure to covid-19: Secondary | ICD-10-CM | POA: Diagnosis not present

## 2020-07-08 ENCOUNTER — Other Ambulatory Visit: Payer: Self-pay

## 2020-07-08 ENCOUNTER — Telehealth (INDEPENDENT_AMBULATORY_CARE_PROVIDER_SITE_OTHER): Payer: Medicaid Other | Admitting: Psychiatry

## 2020-07-08 DIAGNOSIS — F909 Attention-deficit hyperactivity disorder, unspecified type: Secondary | ICD-10-CM | POA: Diagnosis not present

## 2020-07-08 DIAGNOSIS — F410 Panic disorder [episodic paroxysmal anxiety] without agoraphobia: Secondary | ICD-10-CM | POA: Diagnosis not present

## 2020-07-08 DIAGNOSIS — F3341 Major depressive disorder, recurrent, in partial remission: Secondary | ICD-10-CM | POA: Diagnosis not present

## 2020-07-08 DIAGNOSIS — F411 Generalized anxiety disorder: Secondary | ICD-10-CM

## 2020-07-08 MED ORDER — MIRTAZAPINE 15 MG PO TABS: 15.0000 mg | ORAL_TABLET | Freq: Every day | ORAL | 2 refills | Status: DC

## 2020-07-08 MED ORDER — AMPHETAMINE-DEXTROAMPHET ER 10 MG PO CP24: 10.0000 mg | ORAL_CAPSULE | Freq: Every day | ORAL | 0 refills | Status: DC

## 2020-07-08 NOTE — Progress Notes (Signed)
BH MD/PA/NP OP Progress Note  07/08/2020 10:48 AM Ana Hunter  MRN:  [id number] Interview was conducted by phone and I verified that I was speaking with the correct person using two identifiers. I discussed the limitations of evaluation and management by telemedicine and  the availability of in person appointments. Patient expressed understanding and agreed to proceed. Participants in the visit: patient (location - home); physician (location - home office).  Chief Complaint: Lack of focus, anxiety.  HPI: 19yo single femalewithhx od depressed mood and excessive worrying. In the past she engaged in SIB by cutting to deal with high level of stress. She denies having any hx of suicidal thoughts however. She has not engaged in SIB for several monthsnow. 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 She has been clean for past 6 months. Ana Hunter was in counseling at [facility] in the past. She has never been on any psychotropic medications for depression/anxiety.We attempted to start fluoxetine but she could not tolerate nausea and vomiting that this medication caused. Similarly trazodone prescribed for insomnia also made her feel nauseated and did not help with sleep.We have started mirtazapine and she tolerates it much better, less depressed, better sleep(she feels it is "too good" since dose was increased to 30 mg). Anxiety has not decreased much and shewashavingpanic like symptoms almost daily.She has been c/o daytime fatigue,lack of focus,forgetfulness.These symptoms are not new but seemed to have worsened in the past few years.We added Wellbutrin SRnow 200 mg bidand while she tolerates it well it is of not much help. Her oldersister hasADD and is prescribed Adderall xr 25 mg. Ana Hunter took a lower dose once and found it helpful.We have dc Wellbutrin and added Adderall XR 20 mg. She reports feeling "much better", no fatigue, no  problems with focusing, no depression, good sleep. Unfortunately she has lost almost 30 lbs on it and decided to stop it. Panic attacks continue on occasion - she has tried lorazepam, diazepam, clonazepam and alprazolam. She feels that diazepam has been most effective. Sleep also remains a problem. She has failed trazodone, zolpidem, doxepin but mirtazapine at 15 mg dose seems to work well and she resumed taking it. We have tried Strattera for ADHD but she finds it ineffective and would like to try a lower dose of Adderall this time.    Visit Diagnosis:    ICD-10-CM   1. Adult ADHD  F90.9   2. GAD (generalized anxiety disorder)  F41.1   3. Major depressive disorder, recurrent episode, in partial remission (HCC)  F33.41   4. Panic disorder  F41.0     Past Psychiatric History: Please see intake H&P.  Past Medical History:  Past Medical History:  Diagnosis Date  . Environmental and seasonal allergies   . Painful menstrual periods     Past Surgical History:  Procedure Laterality Date  . WISDOM TOOTH EXTRACTION      Family Psychiatric History: Reviewed.  Family History:  Family History  Problem Relation Age of Onset  . Cancer Maternal Grandmother        lung  . Alcohol abuse Father   . Alcohol abuse Paternal Grandfather   . ADD / ADHD Sister     Social History:  Social History   Socioeconomic History  . Marital status: Single    Spouse name: Not on file  . Number of children: 0  . Years of education: Not on file  . Highest education level:  Not on file  Occupational History  . Occupation: Consulting civil engineer  Tobacco Use  . Smoking status: Never Smoker  . Smokeless tobacco: Never Used  Vaping Use  . Vaping Use: Some days  Substance and Sexual Activity  . Alcohol use: Never  . Drug use: Not Currently    Types: Marijuana    Comment: daily for 2 years until about 6 months ago  . Sexual activity: Never  Other Topics Concern  . Not on file  Social History Narrative  . Not on  file   Social Determinants of Health   Financial Resource Strain: Not on file  Food Insecurity: Not on file  Transportation Needs: Not on file  Physical Activity: Not on file  Stress: Not on file  Social Connections: Not on file    Allergies: No Known Allergies  Metabolic Disorder Labs: Lab Results  Component Value Date   HGBA1C 5.0 08/02/2019   MPG 96.8 08/02/2019   MPG 102.54 12/02/2017   No results found for: PROLACTIN Lab Results  Component Value Date   CHOL 177 (H) 08/02/2019   TRIG 64 08/02/2019   HDL 72 08/02/2019   CHOLHDL 2.5 08/02/2019   VLDL 13 08/02/2019   LDLCALC 92 08/02/2019   LDLCALC 79 12/02/2017   Lab Results  Component Value Date   TSH 0.501 12/02/2017    Therapeutic Level Labs: No results found for: LITHIUM No results found for: VALPROATE No components found for:  CBMZ  Current Medications: Current Outpatient Medications  Medication Sig Dispense Refill  . amphetamine-dextroamphetamine (ADDERALL XR) 10 MG 24 hr capsule Take 1 capsule (10 mg total) by mouth daily. 30 capsule 0  . mirtazapine (REMERON) 15 MG tablet Take 1 tablet (15 mg total) by mouth at bedtime. 30 tablet 2  . diazepam (VALIUM) 5 MG tablet Take 1 tablet (5 mg total) by mouth every 8 (eight) hours as needed for anxiety. 90 tablet 1   No current facility-administered medications for this visit.     Psychiatric Specialty Exam: Review of Systems  Psychiatric/Behavioral: Positive for decreased concentration. The patient is nervous/anxious.   All other systems reviewed and are negative.   There were no vitals taken for this visit.There is no height or weight on file to calculate BMI.  General Appearance: NA  Eye Contact:  NA  Speech:  Clear and Coherent and Normal Rate  Volume:  Normal  Mood:  Anxious  Affect:  NA  Thought Process:  Goal Directed  Orientation:  Full (Time, Place, and Person)  Thought Content: Logical   Suicidal Thoughts:  No  Homicidal Thoughts:  No   Memory:  Immediate;   Good Recent;   Good Remote;   Good  Judgement:  Good  Insight:  Good  Psychomotor Activity:  NA  Concentration:  Concentration: Poor  Recall:  Fair  Fund of Knowledge: Good  Language: Good  Akathisia:  Negative  Handed:  Right  AIMS (if indicated): not done  Assets:  Communication Skills Desire for Improvement Housing Physical Health Talents/Skills  ADL's:  Intact  Cognition: WNL  Sleep:  Fair    Assessment and Plan: 19yo single femalewithhx od depressed mood and excessive worrying. In the past she engaged in SIB by cutting to deal with high level of stress. She denies having any hx of suicidal thoughts however. She has not engaged in SIB for several monthsnow. 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 She has been clean  for past 6 months. Ana Hunter was in counseling at [facility] in the past. She has never been on any psychotropic medications for depression/anxiety.We attempted to start fluoxetine but she could not tolerate nausea and vomiting that this medication caused. Similarly trazodone prescribed for insomnia also made her feel nauseated and did not help with sleep.We have started mirtazapine and she tolerates it much better, less depressed, better sleep(she feels it is "too good" since dose was increased to 30 mg). Anxiety has not decreased much and shewashavingpanic like symptoms almost daily.She has been c/o daytime fatigue,lack of focus, forgetfulness.These symptoms are not new but seemed to have worsened in the past few years.We added Wellbutrin SRnow 200 mg bidand while she tolerates it well it is of not much help. Her oldersister hasADD and is prescribed Adderall xr 25 mg. Ana Hunter took a lower dose once and found it helpful.We have dc Wellbutrin and added Adderall XR 20 mg. She reports feeling "much better", no fatigue, no problems with focusing, no depression, good sleep. Unfortunately  she has lost almost 30 lbs on it and decided to stop it. Panic attacks continue on occasion - she has tried lorazepam, diazepam, clonazepam and alprazolam. She feels that diazepam has been most effective. Sleep also remains a problem. She has failed trazodone, zolpidem, doxepin but mirtazapine at 15 mg dose seems to work well and she resumed taking it. We have tried Strattera for ADHD but she finds it ineffective and would like to try a lower dose of Adderall this time.   Dx: GAD/Panic disorder;AdultADD;MDD recurrentin partial remission  Plan: We willtry Adderall XR 10 mg, continue mirtazapine 15 mg and diazepam 5 mg prn panic attacks. Next appointment in17month.The plan was discussed with patient who had an opportunity to ask questions and these were all answered. I spend66minutes inphone consultationwith the patient.    Magdalene Patricia, MD 07/08/2020, 10:48 AM

## 2020-08-08 ENCOUNTER — Telehealth (INDEPENDENT_AMBULATORY_CARE_PROVIDER_SITE_OTHER): Payer: Medicaid Other | Admitting: Psychiatry

## 2020-08-08 ENCOUNTER — Other Ambulatory Visit: Payer: Self-pay

## 2020-08-08 DIAGNOSIS — F909 Attention-deficit hyperactivity disorder, unspecified type: Secondary | ICD-10-CM | POA: Diagnosis not present

## 2020-08-08 DIAGNOSIS — F411 Generalized anxiety disorder: Secondary | ICD-10-CM | POA: Diagnosis not present

## 2020-08-08 DIAGNOSIS — F3341 Major depressive disorder, recurrent, in partial remission: Secondary | ICD-10-CM

## 2020-08-08 DIAGNOSIS — F410 Panic disorder [episodic paroxysmal anxiety] without agoraphobia: Secondary | ICD-10-CM

## 2020-08-08 MED ORDER — DIAZEPAM 5 MG PO TABS: 5.0000 mg | ORAL_TABLET | Freq: Three times a day (TID) | ORAL | 1 refills | Status: AC | PRN

## 2020-08-08 MED ORDER — AMPHETAMINE-DEXTROAMPHET ER 10 MG PO CP24: 10.0000 mg | ORAL_CAPSULE | ORAL | 0 refills | Status: DC

## 2020-08-08 NOTE — Progress Notes (Signed)
BH MD/PA/NP OP Progress Note  08/08/2020 10:07 AM Kamiah Merissa Renwick  MRN:  [id number] Interview was conducted by phone and I verified that I was speaking with the correct person using two identifiers. I discussed the limitations of evaluation and management by telemedicine and  the availability of in person appointments. Patient expressed understanding and agreed to proceed. Participants in the visit: patient (location - home); physician (location - home office).  Chief Complaint: Some anxiety.  HPI: 20yo single femalewithhx od depressed mood and excessive worrying. In the past she engaged in SIB by cutting to deal with high level of stress. She denies having any hx of suicidal thoughts however. She has not engaged in SIB for several monthsnow. 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 She has been clean for past 6 months. Labrea was in counseling at [facility] in the past. She has never been on any psychotropic medications for depression/anxiety.We attempted to start fluoxetine but she could not tolerate nausea and vomiting that this medication caused. Similarly trazodone prescribed for insomnia also made her feel nauseated and did not help with sleep.We have started mirtazapine and she tolerates it much better, less depressed, better sleep(she feels it was "too good" after dose was increased to 30 mg; she is on 15 mg at this time). Anxiety has decreased some.She has been c/o daytime fatigue,lack of focus, forgetfulness.These symptoms are not new but seemed to have worsened in the past few years.We added Wellbutrin SRnow 200 mg bidand while she tolerated it well it was of not much help. Her oldersister hasADD and is prescribed Adderall xr 25 mg. Karema took a lower dose once and found it helpful.We have dc Wellbutrin and added Adderall XR 20 mg. She reported feeling "much better", no fatigue, no problems with focusing, no depression,  good sleep.Unfortunately she has lost almost 30 lbs on it and decided to stop it.Panic attacks continue on occasion- she has tried lorazepam, diazepam, clonazepam and alprazolam. She feels that diazepam has been most effective. We have tried Strattera for ADHD but she found it ineffective. We have then added Adderall XR but at a lower 10 mg dose and she reports no issues with appetite suppression and a good response.    Visit Diagnosis:    ICD-10-CM   1. Adult ADHD  F90.9   2. GAD (generalized anxiety disorder)  F41.1   3. Panic disorder  F41.0   4. Major depressive disorder, recurrent episode, in partial remission (HCC)  F33.41     Past Psychiatric History: Please see intake H&P.  Past Medical History:  Past Medical History:  Diagnosis Date  . Environmental and seasonal allergies   . Painful menstrual periods     Past Surgical History:  Procedure Laterality Date  . WISDOM TOOTH EXTRACTION      Family Psychiatric History: Reviewed.  Family History:  Family History  Problem Relation Age of Onset  . Cancer Maternal Grandmother        lung  . Alcohol abuse Father   . Alcohol abuse Paternal Grandfather   . ADD / ADHD Sister     Social History:  Social History   Socioeconomic History  . Marital status: Single    Spouse name: Not on file  . Number of children: 0  . Years of education: Not on file  . Highest education level: Not on file  Occupational History  . Occupation: Consulting civil engineer  Tobacco Use  . Smoking  status: Never Smoker  . Smokeless tobacco: Never Used  Vaping Use  . Vaping Use: Some days  Substance and Sexual Activity  . Alcohol use: Never  . Drug use: Not Currently    Types: Marijuana    Comment: daily for 2 years until about 6 months ago  . Sexual activity: Never  Other Topics Concern  . Not on file  Social History Narrative  . Not on file   Social Determinants of Health   Financial Resource Strain: Not on file  Food Insecurity: Not on file   Transportation Needs: Not on file  Physical Activity: Not on file  Stress: Not on file  Social Connections: Not on file    Allergies: No Known Allergies  Metabolic Disorder Labs: Lab Results  Component Value Date   HGBA1C 5.0 08/02/2019   MPG 96.8 08/02/2019   MPG 102.54 12/02/2017   No results found for: PROLACTIN Lab Results  Component Value Date   CHOL 177 (H) 08/02/2019   TRIG 64 08/02/2019   HDL 72 08/02/2019   CHOLHDL 2.5 08/02/2019   VLDL 13 08/02/2019   LDLCALC 92 08/02/2019   LDLCALC 79 12/02/2017   Lab Results  Component Value Date   TSH 0.501 12/02/2017    Therapeutic Level Labs: No results found for: LITHIUM No results found for: VALPROATE No components found for:  CBMZ  Current Medications: Current Outpatient Medications  Medication Sig Dispense Refill  . amphetamine-dextroamphetamine (ADDERALL XR) 10 MG 24 hr capsule Take 1 capsule (10 mg total) by mouth every morning. 30 capsule 0  . [START ON 09/07/2020] amphetamine-dextroamphetamine (ADDERALL XR) 10 MG 24 hr capsule Take 1 capsule (10 mg total) by mouth every morning. 30 capsule 0  . [START ON 10/07/2020] amphetamine-dextroamphetamine (ADDERALL XR) 10 MG 24 hr capsule Take 1 capsule (10 mg total) by mouth every morning. 30 capsule 0  . diazepam (VALIUM) 5 MG tablet Take 1 tablet (5 mg total) by mouth every 8 (eight) hours as needed for anxiety. 60 tablet 1  . mirtazapine (REMERON) 15 MG tablet Take 1 tablet (15 mg total) by mouth at bedtime. 30 tablet 2   No current facility-administered medications for this visit.      Psychiatric Specialty Exam: Review of Systems  Psychiatric/Behavioral: The patient is nervous/anxious.   All other systems reviewed and are negative.   There were no vitals taken for this visit.There is no height or weight on file to calculate BMI.  General Appearance: NA  Eye Contact:  NA  Speech:  Clear and Coherent and Normal Rate  Volume:  Normal  Mood:  Anxious   Affect:  NA  Thought Process:  Goal Directed and Linear  Orientation:  Full (Time, Place, and Person)  Thought Content: Logical   Suicidal Thoughts:  No  Homicidal Thoughts:  No  Memory:  Immediate;   Good Recent;   Good Remote;   Good  Judgement:  Good  Insight:  Good  Psychomotor Activity:  NA  Concentration:  Concentration: Good  Recall:  Good  Fund of Knowledge: Good  Language: Good  Akathisia:  Negative  Handed:  Right  AIMS (if indicated): not done  Assets:  Communication Skills  ADL's:  Intact  Cognition: WNL  Sleep:  Good    Assessment and Plan: 20yo single femalewithhx od depressed mood and excessive worrying. In the past she engaged in SIB by cutting to deal with high level of stress. She denies having any hx of suicidal thoughts however. She  has not engaged in SIB for several monthsnow. 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 She has been clean for past 6 months. Phelicia was in counseling at [facility] in the past. She has never been on any psychotropic medications for depression/anxiety.We attempted to start fluoxetine but she could not tolerate nausea and vomiting that this medication caused. Similarly trazodone prescribed for insomnia also made her feel nauseated and did not help with sleep.We have started mirtazapine and she tolerates it much better, less depressed, better sleep(she feels it was "too good" after dose was increased to 30 mg; she is on 15 mg at this time). Anxiety has decreased some.She has been c/o daytime fatigue,lack of focus, forgetfulness.These symptoms are not new but seemed to have worsened in the past few years.We added Wellbutrin SRnow 200 mg bidand while she tolerated it well it was of not much help. Her oldersister hasADD and is prescribed Adderall xr 25 mg. Jahnae took a lower dose once and found it helpful.We have dc Wellbutrin and added Adderall XR 20 mg. She reported  feeling "much better", no fatigue, no problems with focusing, no depression, good sleep.Unfortunately she has lost almost 30 lbs on it and decided to stop it.Panic attacks continue on occasion- she has tried lorazepam, diazepam, clonazepam and alprazolam. She feels that diazepam has been most effective. We have tried Strattera for ADHD but she found it ineffective. We have then added Adderall XR but at a lower 10 mg dose and she reports no issues with appetite suppression and a good response.   Dx: GAD/Panic disorder;AdultADD;MDD recurrentin partial remission  Plan: We willcontinue Adderall XR 10 mg, mirtazapine 15 mg and diazepam 5 mg prn panic attacks.Next appointment in69month.The plan was discussed with patient who had an opportunity to ask questions and these were all answered. I spend29minutes inphone consultationwith the patient.   Magdalene Patricia, MD 08/08/2020, 10:07 AM

## 2020-09-25 ENCOUNTER — Other Ambulatory Visit: Payer: Self-pay

## 2020-09-25 ENCOUNTER — Encounter: Payer: Self-pay | Admitting: Emergency Medicine

## 2020-09-25 ENCOUNTER — Emergency Department
Admission: EM | Admit: 2020-09-25 | Discharge: 2020-09-25 | Disposition: A | Discharge status: Home / Self Care | Payer: Medicaid Other | Attending: Emergency Medicine | Admitting: Emergency Medicine

## 2020-09-25 ENCOUNTER — Emergency Department: Payer: Medicaid Other

## 2020-09-25 DIAGNOSIS — S300XXA Contusion of lower back and pelvis, initial encounter: Secondary | ICD-10-CM | POA: Diagnosis not present

## 2020-09-25 DIAGNOSIS — W01198A Fall on same level from slipping, tripping and stumbling with subsequent striking against other object, initial encounter: Secondary | ICD-10-CM | POA: Insufficient documentation

## 2020-09-25 DIAGNOSIS — Y9389 Activity, other specified: Secondary | ICD-10-CM | POA: Insufficient documentation

## 2020-09-25 DIAGNOSIS — M533 Sacrococcygeal disorders, not elsewhere classified: Secondary | ICD-10-CM | POA: Diagnosis not present

## 2020-09-25 DIAGNOSIS — S3993XA Unspecified injury of pelvis, initial encounter: Secondary | ICD-10-CM | POA: Diagnosis present

## 2020-09-25 LAB — POC URINE PREG, ED: Preg Test, Ur: NEGATIVE

## 2020-09-25 MED ORDER — NAPROXEN 500 MG PO TABS: 500.0000 mg | ORAL_TABLET | Freq: Two times a day (BID) | ORAL | 0 refills | Status: DC

## 2020-09-25 MED ORDER — HYDROCODONE-ACETAMINOPHEN 5-325 MG PO TABS: 1.0000 | ORAL_TABLET | Freq: Four times a day (QID) | ORAL | 0 refills | Status: AC | PRN

## 2020-09-25 NOTE — ED Triage Notes (Signed)
Pt reports was playing with her dog and she was by her bed and her dog jumped in her lap and she went against the bed frame and thinks she may have broken her tailbone.

## 2020-09-25 NOTE — ED Notes (Signed)
Pt states she fell and hit her tailbone on corner of bed on Thursday and has had pain w/ certain positions such as straining on toilet. Pt states she heard her tailbone "pop" when she fell. Pt states she has been able to have BM's, but it is painful. See also the triage note.

## 2020-09-25 NOTE — Discharge Instructions (Addendum)
Follow-up with your primary care provider, urgent care or acute care at [facility] if any continued problems.  Ice and sitting on a cushion or pillow will help with your pain.  A prescription for medication was sent to your pharmacy.  1 is for naproxen 500 mg for inflammation the other is for hydrocodone which should only be taken every 6 hours for moderate pain.  Be aware that this medication could cause drowsiness and increase your risk for injury so therefore do not drive or operate machinery while taking it.

## 2020-09-25 NOTE — ED Provider Notes (Signed)
[facility] Emergency Department Provider Note   ____________________________________________   Event Date/Time   First MD Initiated Contact with Patient 09/25/20 1112     (approximate)  I have reviewed the triage vital signs and the nursing notes.   HISTORY  Chief Complaint Tailbone Pain   HPI Ana Hunter is a 20 y.o. female presents to the ED with complaint of tailbone pain.  Patient states that 3 days ago she was playing with her dog who jumped into her lap and she fell against the bed frame.  Patient reports that she heard a "pop" and believes that she has a fracture.  Patient has continued to be ambulatory and denies any head injury during this event.  She has taken ibuprofen for the pain without any relief.  She rates her pain as an 8 out of 10.       Past Medical History:  Diagnosis Date  . Environmental and seasonal allergies   . Painful menstrual periods     Patient Active Problem List   Diagnosis Date Noted  . Adult ADHD 01/13/2020  . Panic disorder 08/20/2019  . Major depressive disorder, recurrent episode, in partial remission (HCC) 06/15/2019  . GAD (generalized anxiety disorder) 06/15/2019  . Dysmenorrhea 07/09/2016  . Menorrhagia 07/09/2016    Past Surgical History:  Procedure Laterality Date  . WISDOM TOOTH EXTRACTION      Prior to Admission medications   Medication Sig Start Date End Date Taking? Authorizing Provider  HYDROcodone-acetaminophen (NORCO/VICODIN) 5-325 MG tablet Take 1 tablet by mouth every 6 (six) hours as needed for moderate pain. 09/25/20 09/25/21 Yes Amanda Steuart L, PA-C  naproxen (NAPROSYN) 500 MG tablet Take 1 tablet (500 mg total) by mouth 2 (two) times daily with a meal. 09/25/20  Yes Tommi Rumps, PA-C  amphetamine-dextroamphetamine (ADDERALL XR) 10 MG 24 hr capsule Take 1 capsule (10 mg total) by mouth every morning. 08/08/20 09/07/20  Pucilowski, Roosvelt Maser, MD  amphetamine-dextroamphetamine  (ADDERALL XR) 10 MG 24 hr capsule Take 1 capsule (10 mg total) by mouth every morning. 09/07/20 10/07/20  Pucilowski, Roosvelt Maser, MD  amphetamine-dextroamphetamine (ADDERALL XR) 10 MG 24 hr capsule Take 1 capsule (10 mg total) by mouth every morning. 10/07/20 11/06/20  Pucilowski, Roosvelt Maser, MD  diazepam (VALIUM) 5 MG tablet Take 1 tablet (5 mg total) by mouth every 8 (eight) hours as needed for anxiety. 08/08/20 10/07/20  Pucilowski, Roosvelt Maser, MD  mirtazapine (REMERON) 15 MG tablet Take 1 tablet (15 mg total) by mouth at bedtime. 07/08/20 10/06/20  Pucilowski, Roosvelt Maser, MD    Allergies Patient has no known allergies.  Family History  Problem Relation Age of Onset  . Cancer Maternal Grandmother        lung  . Alcohol abuse Father   . Alcohol abuse Paternal Grandfather   . ADD / ADHD Sister     Social History Social History   Tobacco Use  . Smoking status: Never Smoker  . Smokeless tobacco: Never Used  Vaping Use  . Vaping Use: Some days  Substance Use Topics  . Alcohol use: Never  . Drug use: Not Currently    Types: Marijuana    Comment: daily for 2 years until about 6 months ago    Review of Systems Constitutional: No fever/chills Eyes: No visual changes. Cardiovascular: Denies chest pain. Respiratory: Denies shortness of breath. Gastrointestinal: No abdominal pain.  No nausea, no vomiting.  No diarrhea.  No constipation. Genitourinary: Negative for dysuria.  Musculoskeletal: Positive for coccyx pain. Skin: Negative for rash. Neurological: Negative for headaches, focal weakness or numbness. ____________________________________________   PHYSICAL EXAM:  VITAL SIGNS: ED Triage Vitals  Enc Vitals Group     BP 09/25/20 1011 121/75     Pulse Rate 09/25/20 1011 85     Resp 09/25/20 1011 16     Temp 09/25/20 1011 98.6 F (37 C)     Temp Source 09/25/20 1011 Oral     SpO2 09/25/20 1011 99 %     Weight 09/25/20 1006 114 lb (51.7 kg)     Height 09/25/20 1006 5\' 7"  (1.702 m)      Head Circumference --      Peak Flow --      Pain Score 09/25/20 1005 8     Pain Loc --      Pain Edu? --      Excl. in GC? --    Constitutional: Alert and oriented. Well appearing and in no acute distress. Eyes: Conjunctivae are normal.  Head: Atraumatic. Neck: No stridor.   Cardiovascular: Normal rate, regular rhythm. Grossly normal heart sounds.  Good peripheral circulation. Respiratory: Normal respiratory effort.  No retractions. Lungs CTAB. Gastrointestinal: Soft and nontender. No distention.  Musculoskeletal: Tender lower sacral coccyx area without gross deformity. Neurologic:  Normal speech and language. No gross focal neurologic deficits are appreciated. No gait instability. Skin:  Skin is warm, dry and intact. No rash noted. Psychiatric: Mood and affect are normal. Speech and behavior are normal.  ____________________________________________   LABS (all labs ordered are listed, but only abnormal results are displayed)  Labs Reviewed  POC URINE PREG, ED     RADIOLOGY I, 11/25/20, personally viewed and evaluated these images (plain radiographs) as part of my medical decision making, as well as reviewing the written report by the radiologist.   Official radiology report(s): DG Sacrum/Coccyx  Result Date: 09/25/2020 CLINICAL DATA:  Tailbone pain. EXAM: SACRUM AND COCCYX - 2+ VIEW COMPARISON:  Hip radiographs dated 07/22/2013. FINDINGS: There is no evidence of fracture or other focal bone lesions. IMPRESSION: Negative. Electronically Signed   By: 09/21/2013 M.D.   On: 09/25/2020 12:17    ____________________________________________   PROCEDURES  Procedure(s) performed (including Critical Care):  Procedures   ____________________________________________   INITIAL IMPRESSION / ASSESSMENT AND PLAN / ED COURSE  As part of my medical decision making, I reviewed the following data within the electronic MEDICAL RECORD NUMBER Notes from prior ED visits and  [place] Controlled Substance Database  20 year old female presents to the ED with complaint of pain to her coccyx.  Patient fell 3 days ago at which time she believes that she may have fractured her coccyx when she fell against the bed frame when her dog jumped up into her lap.  Patient has taken ibuprofen without relief of her pain.  X-rays were negative for acute fracture.  Patient was given naproxen 500 mg twice daily and a prescription for hydrocodone to take for moderate to severe pain.  Patient is encouraged to use ice and also sit on a pillow for protection and less irritation.  She is to follow-up with her PCP if any continued problems. ____________________________________________   FINAL CLINICAL IMPRESSION(S) / ED DIAGNOSES  Final diagnoses:  Contusion of coccyx, initial encounter     ED Discharge Orders         Ordered    HYDROcodone-acetaminophen (NORCO/VICODIN) 5-325 MG tablet  Every 6 hours PRN  09/25/20 1235    naproxen (NAPROSYN) 500 MG tablet  2 times daily with meals        09/25/20 1235          *Please note:  Akeira Arine Foley was evaluated in Emergency Department on 09/25/2020 for the symptoms described in the history of present illness. She was evaluated in the context of the global COVID-19 pandemic, which necessitated consideration that the patient might be at risk for infection with the SARS-CoV-2 virus that causes COVID-19. Institutional protocols and algorithms that pertain to the evaluation of patients at risk for COVID-19 are in a state of rapid change based on information released by regulatory bodies including the CDC and federal and state organizations. These policies and algorithms were followed during the patient's care in the ED.  Some ED evaluations and interventions may be delayed as a result of limited staffing during and the pandemic.*   Note:  This document was prepared using Dragon voice recognition software and may include unintentional dictation  errors.    Tommi Rumps, PA-C 09/25/20 1342    Delton [place], MD 09/25/20 [phone]

## 2021-03-09 DIAGNOSIS — J019 Acute sinusitis, unspecified: Secondary | ICD-10-CM | POA: Diagnosis not present

## 2022-03-05 ENCOUNTER — Ambulatory Visit (INDEPENDENT_AMBULATORY_CARE_PROVIDER_SITE_OTHER): Payer: Medicaid Other

## 2022-03-05 VITALS — BP 111/72 | HR 74 | Resp 16 | Ht 67.0 in | Wt 115.9 lb

## 2022-03-05 DIAGNOSIS — Z3201 Encounter for pregnancy test, result positive: Secondary | ICD-10-CM | POA: Diagnosis not present

## 2022-03-05 DIAGNOSIS — Z3689 Encounter for other specified antenatal screening: Secondary | ICD-10-CM

## 2022-03-05 DIAGNOSIS — N912 Amenorrhea, unspecified: Secondary | ICD-10-CM

## 2022-03-05 LAB — POCT URINE PREGNANCY: Preg Test, Ur: POSITIVE — AB

## 2022-03-05 NOTE — Patient Instructions (Signed)
Common Medications Safe in Pregnancy  Acne:      Constipation:  Benzoyl Peroxide     Colace  Clindamycin      Dulcolax Suppository  Topica Erythromycin     Fibercon  Salicylic Acid      Metamucil         Miralax AVOID:        Senakot   Accutane    Cough:  Retin-A       Cough Drops  Tetracycline      Phenergan w/ Codeine if Rx  Minocycline      Robitussin (Plain & DM)  Antibiotics:     Crabs/Lice:  Ceclor       RID  Cephalosporins    AVOID:  E-Mycins      Kwell  Keflex  Macrobid/Macrodantin   Diarrhea:  Penicillin      Kao-Pectate  Zithromax      Imodium AD         PUSH FLUIDS AVOID:       Cipro     Fever:  Tetracycline      Tylenol (Regular or Extra  Minocycline       Strength)  Levaquin      Extra Strength-Do not          Exceed 8 tabs/24 hrs Caffeine:        <200mg/day (equiv. To 1 cup of coffee or  approx. 3 12 oz sodas)         Gas: Cold/Hayfever:       Gas-X  Benadryl      Mylicon  Claritin       Phazyme  **Claritin-D        Chlor-Trimeton    Headaches:  Dimetapp      ASA-Free Excedrin  Drixoral-Non-Drowsy     Cold Compress  Mucinex (Guaifenasin)     Tylenol (Regular or Extra  Sudafed/Sudafed-12 Hour     Strength)  **Sudafed PE Pseudoephedrine   Tylenol Cold & Sinus     Vicks Vapor Rub  Zyrtec  **AVOID if Problems With Blood Pressure         Heartburn: Avoid lying down for at least 1 hour after meals  Aciphex      Maalox     Rash:  Milk of Magnesia     Benadryl    Mylanta       1% Hydrocortisone Cream  Pepcid  Pepcid Complete   Sleep Aids:  Prevacid      Ambien   Prilosec       Benadryl  Rolaids       Chamomile Tea  Tums (Limit 4/day)     Unisom         Tylenol PM         Warm milk-add vanilla or  Hemorrhoids:       Sugar for taste  Anusol/Anusol H.C.  (RX: Analapram 2.5%)  Sugar Substitutes:  Hydrocortisone OTC     Ok in moderation  Preparation H      Tucks        Vaseline lotion applied to tissue with  wiping    Herpes:     Throat:  Acyclovir      Oragel  Famvir  Valtrex     Vaccines:         Flu Shot Leg Cramps:       *Gardasil  Benadryl      Hepatitis A         Hepatitis B Nasal Spray:         Pneumovax  Saline Nasal Spray     Polio Booster         Tetanus Nausea:       Tuberculosis test or PPD  Vitamin B6 25 mg TID   AVOID:    Dramamine      *Gardasil  Emetrol       Live Poliovirus  Ginger Root 250 mg QID    MMR (measles, mumps &  High Complex Carbs @ Bedtime    rebella)  Sea Bands-Accupressure    Varicella (Chickenpox)  Unisom 1/2 tab TID     *No known complications           If received before Pain:         Known pregnancy;   Darvocet       Resume series after  Lortab        Delivery  Percocet    Yeast:   Tramadol      Femstat  Tylenol 3      Gyne-lotrimin  Ultram       Monistat  Vicodin           MISC:         All Sunscreens           Hair Coloring/highlights          Insect Repellant's          (Including DEET)         Mystic Tans    Commonly Asked Questions During Pregnancy  Cats: A parasite can be excreted in cat feces.  To avoid exposure you need to have another person empty the little box.  If you must empty the litter box you will need to wear gloves.  Wash your hands after handling your cat.  This parasite can also be found in raw or undercooked meat so this should also be avoided.  Colds, Sore Throats, Flu: Please check your medication sheet to see what you can take for symptoms.  If your symptoms are unrelieved by these medications please call the office.  Dental Work: Most any dental work Investment banker, corporate recommends is permitted.  X-rays should only be taken during the first trimester if absolutely necessary.  Your abdomen should be shielded with a lead apron during all x-rays.  Please notify your provider prior to receiving any x-rays.  Novocaine is fine; gas is not recommended.  If your dentist requires a note from [place] prior to dental work please call the  office and we will provide one for you.  Exercise: Exercise is an important part of staying healthy during your pregnancy.  You may continue most exercises you were accustomed to prior to pregnancy.  Later in your pregnancy you will most likely notice you have difficulty with activities requiring balance like riding a bicycle.  It is important that you listen to your body and avoid activities that put you at a higher risk of falling.  Adequate rest and staying well hydrated are a must!  If you have questions about the safety of specific activities ask your provider.    Exposure to Children with illness: Try to avoid obvious exposure; report any symptoms to [place] when noted,  If you have chicken pos, red measles or mumps, you should be immune to these diseases.   Please do not take any vaccines while pregnant unless you have checked with your OB provider.  Fetal Movement: After 28 weeks we recommend you do "kick counts" twice daily.  Lie or sit down  in a calm quiet environment and count your baby movements "kicks".  You should feel your baby at least 10 times per hour.  If you have not felt 10 kicks within the first hour get up, walk around and have something sweet to eat or drink then repeat for an additional hour.  If count remains less than 10 per hour notify your provider.  Fumigating: Follow your pest control agent's advice as to how long to stay out of your home.  Ventilate the area well before re-entering.  Hemorrhoids:   Most over-the-counter preparations can be used during pregnancy.  Check your medication to see what is safe to use.  It is important to use a stool softener or fiber in your diet and to drink lots of liquids.  If hemorrhoids seem to be getting worse please call the office.   Hot Tubs:  Hot tubs Jacuzzis and saunas are not recommended while pregnant.  These increase your internal body temperature and should be avoided.  Intercourse:  Sexual intercourse is safe during pregnancy as  long as you are comfortable, unless otherwise advised by your provider.  Spotting may occur after intercourse; report any bright red bleeding that is heavier than spotting.  Labor:  If you know that you are in labor, please go to the hospital.  If you are unsure, please call the office and let us help you decide what to do.  Lifting, straining, etc:  If your job requires heavy lifting or straining please check with your provider for any limitations.  Generally, you should not lift items heavier than that you can lift simply with your hands and arms (no back muscles)  Painting:  Paint fumes do not harm your pregnancy, but may make you ill and should be avoided if possible.  Latex or water based paints have less odor than oils.  Use adequate ventilation while painting.  Permanents & Hair Color:  Chemicals in hair dyes are not recommended as they cause increase hair dryness which can increase hair loss during pregnancy.  " Highlighting" and permanents are allowed.  Dye may be absorbed differently and permanents may not hold as well during pregnancy.  Sunbathing:  Use a sunscreen, as skin burns easily during pregnancy.  Drink plenty of fluids; avoid over heating.  Tanning Beds:  Because their possible side effects are still unknown, tanning beds are not recommended.  Ultrasound Scans:  Routine ultrasounds are performed at approximately 20 weeks.  You will be able to see your baby's general anatomy an if you would like to know the gender this can usually be determined as well.  If it is questionable when you conceived you may also receive an ultrasound early in your pregnancy for dating purposes.  Otherwise ultrasound exams are not routinely performed unless there is a medical necessity.  Although you can request a scan we ask that you pay for it when conducted because insurance does not cover " patient request" scans.  Work: If your pregnancy proceeds without complications you may work until your due  date, unless your physician or employer advises otherwise.  Round Ligament Pain/Pelvic Discomfort:  Sharp, shooting pains not associated with bleeding are fairly common, usually occurring in the second trimester of pregnancy.  They tend to be worse when standing up or when you remain standing for long periods of time.  These are the result of pressure of certain pelvic ligaments called "round ligaments".  Rest, Tylenol and heat seem to be the most effective relief.  As the womb and fetus grow, they rise out of the pelvis and the discomfort improves.  Please notify the office if your pain seems different than that described.  It may represent a more serious condition.  Morning Sickness  Morning sickness is when you feel like you may vomit (feel nauseous) during pregnancy. Sometimes, you may vomit. Morning sickness most often happens in the morning, but it can also happen at any time of the day. Some women may have morning sickness that makes them vomit all the time. This is a more serious problem that needs treatment. What are the causes? The cause of this condition is not known. What increases the risk? You had vomiting or a feeling like you may vomit before your pregnancy. You had morning sickness in another pregnancy. You are pregnant with more than one baby, such as twins. What are the signs or symptoms? Feeling like you may vomit. Vomiting. How is this treated? Treatment is usually not needed for this condition. You may only need to change what you eat. In some cases, your doctor may give you some things to take for your condition. These include: Vitamin B6 supplements. Medicines to treat the feeling that you may vomit. Ginger. Follow these instructions at home: Medicines Take over-the-counter and prescription medicines only as told by your doctor. Do not take any medicines until you talk with your doctor about them first. Take multivitamins before you get pregnant. These can stop or  lessen the symptoms of morning sickness. Eating and drinking Eat dry toast or crackers before getting out of bed. Eat 5 or 6 small meals a day. Eat dry and bland foods like rice and baked potatoes. Do not eat greasy, fatty, or spicy foods. Have someone cook for you if the smell of food causes you to vomit or to feel like you may vomit. If you feel like you may vomit after taking prenatal vitamins, take them at night or with a snack. Eat protein foods when you need a snack. Nuts, yogurt, and cheese are good choices. Drink fluids throughout the day. Try ginger ale made with real ginger, ginger tea made from fresh grated ginger, or ginger candies. General instructions Do not smoke or use any products that contain nicotine or tobacco. If you need help quitting, ask your doctor. Use an air purifier to keep the air in your house free of smells. Get lots of fresh air. Try to avoid smells that make you feel sick. Try wearing an acupressure wristband. This is a wristband that is used to treat seasickness. Try a treatment called acupuncture. In this treatment, a doctor puts needles into certain areas of your body to make you feel better. Contact a doctor if: You need medicine to feel better. You feel dizzy or light-headed. You are losing weight. Get help right away if: The feeling that you may vomit will not go away, or you cannot stop vomiting. You faint. You have very bad pain in your belly. Summary Morning sickness is when you feel like you may vomit (feel nauseous) during pregnancy. You may feel sick in the morning, but you can feel this way at any time of the day. Making some changes to what you eat may help your symptoms go away. This information is not intended to replace advice given to you by your health care provider. Make sure you discuss any questions you have with your health care provider. Document Revised: 12/21/2019 Document Reviewed: 11/30/2019 Elsevier Patient Education  2023  Elsevier  Inc. First Trimester of Pregnancy  The first trimester of pregnancy starts on the first day of your last menstrual period until the end of week 12. This is also called months 1 through 3 of pregnancy. Body changes during your first trimester Your body goes through many changes during pregnancy. The changes usually return to normal after your baby is born. Physical changes You may gain or lose weight. Your breasts may grow larger and hurt. The area around your nipples may get darker. Dark spots or blotches may develop on your face. You may have changes in your hair. Health changes You may feel like you might vomit (nauseous), and you may vomit. You may have heartburn. You may have headaches. You may have trouble pooping (constipation). Your gums may bleed. Other changes You may get tired easily. You may pee (urinate) more often. Your menstrual periods will stop. You may not feel hungry. You may want to eat certain kinds of food. You may have changes in your emotions from day to day. You may have more dreams. Follow these instructions at home: Medicines Take over-the-counter and prescription medicines only as told by your doctor. Some medicines are not safe during pregnancy. Take a prenatal vitamin that contains at least 600 micrograms (mcg) of folic acid. Eating and drinking Eat healthy meals that include: Fresh fruits and vegetables. Whole grains. Good sources of protein, such as meat, eggs, or tofu. Low-fat dairy products. Avoid raw meat and unpasteurized juice, milk, and cheese. If you feel like you may vomit, or you vomit: Eat 4 or 5 small meals a day instead of 3 large meals. Try eating a few soda crackers. Drink liquids between meals instead of during meals. You may need to take these actions to prevent or treat trouble pooping: Drink enough fluids to keep your pee (urine) pale yellow. Eat foods that are high in fiber. These include beans, whole grains, and  fresh fruits and vegetables. Limit foods that are high in fat and sugar. These include fried or sweet foods. Activity Exercise only as told by your doctor. Most people can do their usual exercise routine during pregnancy. Stop exercising if you have cramps or pain in your lower belly (abdomen) or low back. Do not exercise if it is too hot or too humid, or if you are in a place of great height (high altitude). Avoid heavy lifting. If you choose to, you may have sex unless your doctor tells you not to. Relieving pain and discomfort Wear a good support bra if your breasts are sore. Rest with your legs raised (elevated) if you have leg cramps or low back pain. If you have bulging veins (varicose veins) in your legs: Wear support hose as told by your doctor. Raise your feet for 15 minutes, 3-4 times a day. Limit salt in your food. Safety Wear your seat belt at all times when you are in a car. Talk with your doctor if someone is hurting you or yelling at you. Talk with your doctor if you are feeling sad or have thoughts of hurting yourself. Lifestyle Do not use hot tubs, steam rooms, or saunas. Do not douche. Do not use tampons or scented sanitary pads. Do not use herbal medicines, illegal drugs, or medicines that are not approved by your doctor. Do not drink alcohol. Do not smoke or use any products that contain nicotine or tobacco. If you need help quitting, ask your doctor. Avoid cat litter boxes and soil that is used by cats. These  carry germs that can cause harm to the baby and can cause a loss of your baby by miscarriage or stillbirth. General instructions Keep all follow-up visits. This is important. Ask for help if you need counseling or if you need help with nutrition. Your doctor can give you advice or tell you where to go for help. Visit your dentist. At home, brush your teeth with a soft toothbrush. Floss gently. Write down your questions. Take them to your prenatal visits. Where  to find more information American Pregnancy Association: americanpregnancy.org [employer]: www.acog.org Office on Women's Health: KeywordPortfolios.com.br Contact a doctor if: You are dizzy. You have a fever. You have mild cramps or pressure in your lower belly. You have a nagging pain in your belly area. You continue to feel like you may vomit, you vomit, or you have watery poop (diarrhea) for 24 hours or longer. You have a bad-smelling fluid coming from your vagina. You have pain when you pee. You are exposed to a disease that spreads from person to person, such as chickenpox, measles, Zika virus, HIV, or hepatitis. Get help right away if: You have spotting or bleeding from your vagina. You have very bad belly cramping or pain. You have shortness of breath or chest pain. You have any kind of injury, such as from a fall or a car crash. You have new or increased pain, swelling, or redness in an arm or leg. Summary The first trimester of pregnancy starts on the first day of your last menstrual period until the end of week 12 (months 1 through 3). Eat 4 or 5 small meals a day instead of 3 large meals. Do not smoke or use any products that contain nicotine or tobacco. If you need help quitting, ask your doctor. Keep all follow-up visits. This information is not intended to replace advice given to you by your health care provider. Make sure you discuss any questions you have with your health care provider. Document Revised: 10/14/2019 Document Reviewed: 08/20/2019 Elsevier Patient Education  [employer].

## 2022-03-05 NOTE — Progress Notes (Signed)
    Nurse Visit NOTE  Subjective:    Patient ID: Ana Hunter, female    DOB: 05-26-2000, 21 y.o.   MRN: [id number]  HPI  Patient is a 21 y.o. G0P0000 female who presents for amenorrhea. She believes she could be pregnant. Pregnancy is undesired. Pregnancy was unplanned. Sexual Activity:  Female:Partner/Boyfriend    Contraception: None   Current symptoms also include: Positive home pregnancy test, cramping, insomnia, and nausea. Last menstrual period was 01/29/2022. She and her boyfriend have been together for 4 years. They are living with his parents. He is very tearful today, because he said they are to young to have a baby and he can't afford it. Both of them agree that the do not want to continue the pregnancy and would like to discuss alternatives.

## 2022-03-07 ENCOUNTER — Ambulatory Visit (INDEPENDENT_AMBULATORY_CARE_PROVIDER_SITE_OTHER): Payer: Medicaid Other

## 2022-03-07 ENCOUNTER — Telehealth: Payer: Self-pay | Admitting: Obstetrics and Gynecology

## 2022-03-07 VITALS — Wt 116.0 lb

## 2022-03-07 DIAGNOSIS — Z369 Encounter for antenatal screening, unspecified: Secondary | ICD-10-CM

## 2022-03-07 DIAGNOSIS — Z3480 Encounter for supervision of other normal pregnancy, unspecified trimester: Secondary | ICD-10-CM

## 2022-03-07 DIAGNOSIS — Z3689 Encounter for other specified antenatal screening: Secondary | ICD-10-CM

## 2022-03-07 NOTE — Progress Notes (Signed)
New OB Intake  I connected with  Ana Hunter on 03/07/22 at 10:15 AM EDT by telephone Video Visit and verified that I am speaking with the correct person using two identifiers. Nurse is located at [facility] and pt is located at home.  I explained I am completing New OB Intake today. We discussed her EDD of 11/05/2022 that is based on LMP of 01/29/2022. Pt is G1/P0. I reviewed her allergies, medications, Medical/Surgical/OB history, and appropriate screenings. Based on history, this is a/an pregnancy uncomplicated .   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  Adv she will have someone deliver her that she has never met before.  Anatomy US Explained first scheduled [place] will be soon for dating and at 20 weeks will have an anatomy scan done.  Labs Discussed genetic screening with patient. Patient desires genetic testing to be drawn at new OB labs. Discussed possible labs to be drawn at new OB appointment.  COVID Vaccine Patient has had COVID vaccine.   Social Determinants of Health Food Insecurity: denies food insecurity Transportation: Patient denies transportation needs.  First visit review I reviewed new OB appt with pt. I explained she will have ob bloodwork and pap smear/pelvic exam if indicated. Explained pt will be seen by Dr. Jeannie Fend at first visit; encounter routed to appropriate provider.   Cleophas Dunker, [place] 03/07/2022  10:45 AM

## 2022-03-07 NOTE — Telephone Encounter (Signed)
Pt is scheduled with Dr. Amalia Hailey with for NOB appt.

## 2022-03-12 ENCOUNTER — Telehealth: Payer: Self-pay

## 2022-03-12 MED ORDER — BONJESTA 20-20 MG PO TBCR: EXTENDED_RELEASE_TABLET | ORAL | 3 refills | Status: DC

## 2022-03-12 NOTE — Telephone Encounter (Signed)
Patient called with concerns of nausea and vomiting in pregnancy. Symptoms started x 1 week ago and they got worse last night. She states she started throwing up bile last night. She has tried taking Vitamin B-6 and Unisom, with very little relief. Her mom's boyfriend gave her some Ondansetron and it has helped. I sent [place] to [facility] on [address].

## 2022-03-12 NOTE — Telephone Encounter (Signed)
Patient calling in to state her pharmacy states they need approval for [facility] per Medicaid

## 2022-03-14 MED ORDER — DOXYLAMINE-PYRIDOXINE 10-10 MG PO TBEC: DELAYED_RELEASE_TABLET | ORAL | 1 refills | Status: DC

## 2022-03-14 NOTE — Telephone Encounter (Signed)
Called in [facility] instead. Patient has been informed of change.

## 2022-03-14 NOTE — Addendum Note (Signed)
Addended by: Chilton Greathouse on: 03/14/2022 01:38 PM   Modules accepted: Orders

## 2022-03-18 ENCOUNTER — Other Ambulatory Visit: Payer: Self-pay

## 2022-03-18 ENCOUNTER — Emergency Department
Admission: EM | Admit: 2022-03-18 | Discharge: 2022-03-18 | Disposition: A | Discharge status: Home / Self Care | Payer: Medicaid Other | Attending: Emergency Medicine | Admitting: Emergency Medicine

## 2022-03-18 ENCOUNTER — Encounter: Payer: Self-pay | Admitting: Emergency Medicine

## 2022-03-18 DIAGNOSIS — D72829 Elevated white blood cell count, unspecified: Secondary | ICD-10-CM | POA: Insufficient documentation

## 2022-03-18 DIAGNOSIS — Z3A01 Less than 8 weeks gestation of pregnancy: Secondary | ICD-10-CM | POA: Diagnosis not present

## 2022-03-18 DIAGNOSIS — E876 Hypokalemia: Secondary | ICD-10-CM | POA: Insufficient documentation

## 2022-03-18 DIAGNOSIS — O219 Vomiting of pregnancy, unspecified: Secondary | ICD-10-CM | POA: Diagnosis present

## 2022-03-18 DIAGNOSIS — O210 Mild hyperemesis gravidarum: Secondary | ICD-10-CM | POA: Diagnosis not present

## 2022-03-18 LAB — CBC WITH DIFFERENTIAL/PLATELET
Abs Immature Granulocytes: 0.08 10*3/uL — ABNORMAL HIGH (ref 0.00–0.07)
Basophils Absolute: 0 10*3/uL (ref 0.0–0.1)
Basophils Relative: 0 %
Eosinophils Absolute: 0 10*3/uL (ref 0.0–0.5)
Eosinophils Relative: 0 %
HCT: 34.9 % — ABNORMAL LOW (ref 36.0–46.0)
Hemoglobin: 11.5 g/dL — ABNORMAL LOW (ref 12.0–15.0)
Immature Granulocytes: 1 %
Lymphocytes Relative: 5 %
Lymphs Abs: 0.8 10*3/uL (ref 0.7–4.0)
MCH: 27.4 pg (ref 26.0–34.0)
MCHC: 33 g/dL (ref 30.0–36.0)
MCV: 83.1 fL (ref 80.0–100.0)
Monocytes Absolute: 0.6 10*3/uL (ref 0.1–1.0)
Monocytes Relative: 5 %
Neutro Abs: 12.3 10*3/uL — ABNORMAL HIGH (ref 1.7–7.7)
Neutrophils Relative %: 89 %
Platelets: 333 10*3/uL (ref 150–400)
RBC: 4.2 MIL/uL (ref 3.87–5.11)
RDW: 13.3 % (ref 11.5–15.5)
WBC: 13.8 10*3/uL — ABNORMAL HIGH (ref 4.0–10.5)
nRBC: 0 % (ref 0.0–0.2)

## 2022-03-18 LAB — COMPREHENSIVE METABOLIC PANEL
ALT: 14 U/L (ref 0–44)
AST: 22 U/L (ref 15–41)
Albumin: 3.8 g/dL (ref 3.5–5.0)
Alkaline Phosphatase: 38 U/L (ref 38–126)
Anion gap: 7 (ref 5–15)
BUN: 7 mg/dL (ref 6–20)
CO2: 20 mmol/L — ABNORMAL LOW (ref 22–32)
Calcium: 8.8 mg/dL — ABNORMAL LOW (ref 8.9–10.3)
Chloride: 108 mmol/L (ref 98–111)
Creatinine, Ser: 0.45 mg/dL (ref 0.44–1.00)
GFR, Estimated: >60 mL/min (ref >60)
Glucose, Bld: 95 mg/dL (ref 70–99)
Potassium: 3.3 mmol/L — ABNORMAL LOW (ref 3.5–5.1)
Sodium: 135 mmol/L (ref 135–145)
Total Bilirubin: 1.2 mg/dL (ref 0.3–1.2)
Total Protein: 7.4 g/dL (ref 6.5–8.1)

## 2022-03-18 LAB — URINALYSIS, ROUTINE W REFLEX MICROSCOPIC
Bilirubin Urine: NEGATIVE
Glucose, UA: NEGATIVE mg/dL
Hgb urine dipstick: NEGATIVE
Ketones, ur: 20 mg/dL — AB
Nitrite: NEGATIVE
Protein, ur: 30 mg/dL — AB
Specific Gravity, Urine: 1.014 (ref 1.005–1.030)
pH: 8 (ref 5.0–8.0)

## 2022-03-18 LAB — LIPASE, BLOOD: Lipase: 34 U/L (ref 11–51)

## 2022-03-18 MED ORDER — SODIUM CHLORIDE 0.9 % IV BOLUS
1000.0000 mL | Freq: Once | INTRAVENOUS | Status: AC
  Administered 2022-03-18: 1000 mL via INTRAVENOUS

## 2022-03-18 MED ORDER — ONDANSETRON 4 MG PO TBDP: 4.0000 mg | ORAL_TABLET | Freq: Three times a day (TID) | ORAL | 0 refills | Status: DC | PRN

## 2022-03-18 MED ORDER — SODIUM CHLORIDE 0.9 % IV SOLN
12.5000 mg | Freq: Once | INTRAVENOUS | Status: AC
  Administered 2022-03-18: 12.5 mg via INTRAVENOUS
  Filled 2022-03-18: qty 12.5

## 2022-03-18 MED ORDER — POTASSIUM CHLORIDE CRYS ER 20 MEQ PO TBCR
20.0000 meq | EXTENDED_RELEASE_TABLET | Freq: Once | ORAL | Status: AC
  Administered 2022-03-18: 20 meq via ORAL
  Filled 2022-03-18: qty 1

## 2022-03-18 MED ORDER — ONDANSETRON HCL 4 MG/2ML IJ SOLN
4.0000 mg | Freq: Once | INTRAMUSCULAR | Status: AC
  Administered 2022-03-18: 4 mg via INTRAVENOUS
  Filled 2022-03-18: qty 2

## 2022-03-18 NOTE — ED Provider Notes (Signed)
[facility] Provider Note    Event Date/Time   First MD Initiated Contact with Patient 03/18/22 1304     (approximate)   History   Chief Complaint Emesis During Pregnancy   HPI  Ana Hunter is a 21 y.o. female, G1P0 at approximately 6 weeks of pregnancy who presents to the ED complaining of nausea and vomiting.  Patient reports that she has been feeling persistently nauseous for about the past week with multiple episodes of dry heaving.  She states that she has begun to vomit over the past 24 hours and has been unable to keep down either liquids or solids during this time.  She has been prescribed likely just by her OB/GYN at [facility], but has had no relief with this medication.  She denies any associated abdominal pain, vaginal bleeding, discharge, dysuria, hematuria, or flank pain.  Patient states her mother is a [employer] scientist and recommended she come to the ED for IV fluids.     Physical Exam   Triage Vital Signs: ED Triage Vitals  Enc Vitals Group     BP 03/18/22 1221 137/82     Pulse Rate 03/18/22 1221 81     Resp 03/18/22 1221 20     Temp 03/18/22 1221 98.3 F (36.8 C)     Temp Source 03/18/22 1221 Oral     SpO2 03/18/22 1221 98 %     Weight 03/18/22 1158 114 lb 10.2 oz (52 kg)     Height 03/18/22 1158 5\' 7"  (1.702 m)     Head Circumference --      Peak Flow --      Pain Score 03/18/22 1158 0     Pain Loc --      Pain Edu? --      Excl. in [place]? --     Most recent vital signs: Vitals:   03/18/22 1221 03/18/22 1615  BP: 137/82 120/79  Pulse: 81 81  Resp: 20 17  Temp: 98.3 F (36.8 C) 98.2 F (36.8 C)  SpO2: 98% 100%    Constitutional: Alert and oriented. Eyes: Conjunctivae are normal. Head: Atraumatic. Nose: No congestion/rhinnorhea. Mouth/Throat: Mucous membranes are dry.  Cardiovascular: Normal rate, regular rhythm. Grossly normal heart sounds.  2+ radial pulses bilaterally. Respiratory: Normal respiratory effort.  No  retractions. Lungs CTAB. Gastrointestinal: Soft and nontender. No distention. Musculoskeletal: No lower extremity tenderness nor edema.  Neurologic:  Normal speech and language. No gross focal neurologic deficits are appreciated.    ED Results / Procedures / Treatments   Labs (all labs ordered are listed, but only abnormal results are displayed) Labs Reviewed  CBC WITH DIFFERENTIAL/PLATELET - Abnormal; Notable for the following components:      Result Value   WBC 13.8 (*)    Hemoglobin 11.5 (*)    HCT 34.9 (*)    Neutro Abs 12.3 (*)    Abs Immature Granulocytes 0.08 (*)    All other components within normal limits  COMPREHENSIVE METABOLIC PANEL - Abnormal; Notable for the following components:   Potassium 3.3 (*)    CO2 20 (*)    Calcium 8.8 (*)    All other components within normal limits  URINALYSIS, ROUTINE W REFLEX MICROSCOPIC - Abnormal; Notable for the following components:   Color, Urine YELLOW (*)    APPearance CLOUDY (*)    Ketones, ur 20 (*)    Protein, ur 30 (*)    Leukocytes,Ua TRACE (*)    Bacteria, UA FEW (*)  All other components within normal limits  LIPASE, BLOOD     PROCEDURES:  Critical Care performed: No  Procedures   MEDICATIONS ORDERED IN ED: Medications  sodium chloride 0.9 % bolus 1,000 mL (0 mLs Intravenous Stopped 03/18/22 1327)  promethazine (PHENERGAN) 12.5 mg in sodium chloride 0.9 % 50 mL IVPB (0 mg Intravenous Stopped 03/18/22 1529)  sodium chloride 0.9 % bolus 1,000 mL (0 mLs Intravenous Stopped 03/18/22 1433)  potassium chloride SA (KLOR-CON M) CR tablet 20 mEq (20 mEq Oral Given 03/18/22 1656)  ondansetron (ZOFRAN) injection 4 mg (4 mg Intravenous Given 03/18/22 1657)     IMPRESSION / MDM / ASSESSMENT AND PLAN / ED COURSE  I reviewed the triage vital signs and the nursing notes.                              21 y.o. female G1, P0 at approximately 6 weeks of pregnancy, who presents to the ED complaining of persistent nausea  and vomiting over the past week with inability to tolerate other liquids or solids.  Patient's presentation is most consistent with acute presentation with potential threat to life or bodily function.  Differential diagnosis includes, but is not limited to, hyperemesis gravidarum, dehydration, electrolyte abnormality, ectopic pregnancy, UTI.  Patient nontoxic-appearing and in no acute distress, vital signs are unremarkable.  She has a benign abdominal exam and with no abdominal pain or vaginal bleeding, I doubt ectopic pregnancy.  She does report significant ongoing nausea and clinically appears dehydrated.  She received 1 L IV fluid bolus in triage, will treat with IV Phenergan and give second liter bolus.  Labs including CBC, CMP, and lipase are pending.  She denies any urinary symptoms but we will screen urinalysis given significant vomiting.  Labs remarkable for mild hypokalemia and mild leukocytosis, no significant AKI or anemia noted, LFTs are unremarkable.  Patient initially with minimal improvement in symptoms following dose of Phenergan, now reports significant improvement following dose of Zofran.  She is tolerating water without difficulty and is requesting to be discharged home.  Given reassuring work-up, this is reasonable and patient was counseled to follow-up with her OB/GYN.  She will be prescribed small amount of Zofran to take as needed if Diclegis is not effective.  She was counseled to return to the ED for new or worsening symptoms, patient agrees with plan.      FINAL CLINICAL IMPRESSION(S) / ED DIAGNOSES   Final diagnoses:  Hyperemesis gravidarum     Rx / DC Orders   ED Discharge Orders          Ordered    ondansetron (ZOFRAN-ODT) 4 MG disintegrating tablet  Every 8 hours PRN        03/18/22 1716             Note:  This document was prepared using Dragon voice recognition software and may include unintentional dictation errors.   Blake Divine,  MD 03/18/22 [phone]

## 2022-03-18 NOTE — ED Triage Notes (Signed)
Pt reports is [redacted] weeks pregnant and having hyperemesis. Pt reports has seen her OB for the same and was given meds but they are not helping. Pt reports was advised to come to the ED for fluids.

## 2022-03-20 ENCOUNTER — Other Ambulatory Visit: Payer: Self-pay

## 2022-03-20 ENCOUNTER — Other Ambulatory Visit: Payer: Medicaid Other

## 2022-03-20 DIAGNOSIS — Z3480 Encounter for supervision of other normal pregnancy, unspecified trimester: Secondary | ICD-10-CM

## 2022-03-20 DIAGNOSIS — Z369 Encounter for antenatal screening, unspecified: Secondary | ICD-10-CM

## 2022-03-21 ENCOUNTER — Ambulatory Visit
Admission: RE | Admit: 2022-03-21 | Discharge: 2022-03-21 | Disposition: A | Discharge status: Home / Self Care | Payer: Medicaid Other | Source: Ambulatory Visit | Attending: Obstetrics and Gynecology | Admitting: Obstetrics and Gynecology

## 2022-03-21 ENCOUNTER — Other Ambulatory Visit: Payer: Self-pay

## 2022-03-21 DIAGNOSIS — Z3A01 Less than 8 weeks gestation of pregnancy: Secondary | ICD-10-CM | POA: Diagnosis not present

## 2022-03-21 DIAGNOSIS — Z369 Encounter for antenatal screening, unspecified: Secondary | ICD-10-CM

## 2022-03-21 DIAGNOSIS — Z3480 Encounter for supervision of other normal pregnancy, unspecified trimester: Secondary | ICD-10-CM

## 2022-03-21 DIAGNOSIS — O3680X0 Pregnancy with inconclusive fetal viability, not applicable or unspecified: Secondary | ICD-10-CM | POA: Diagnosis not present

## 2022-03-21 DIAGNOSIS — Z3687 Encounter for antenatal screening for uncertain dates: Secondary | ICD-10-CM | POA: Diagnosis present

## 2022-03-21 MED ORDER — ONDANSETRON 4 MG PO TBDP: 4.0000 mg | ORAL_TABLET | Freq: Three times a day (TID) | ORAL | 0 refills | Status: DC | PRN

## 2022-03-26 ENCOUNTER — Other Ambulatory Visit: Payer: Self-pay | Admitting: Obstetrics and Gynecology

## 2022-03-26 DIAGNOSIS — O219 Vomiting of pregnancy, unspecified: Secondary | ICD-10-CM

## 2022-03-26 MED ORDER — ONDANSETRON 4 MG PO TBDP: 4.0000 mg | ORAL_TABLET | Freq: Three times a day (TID) | ORAL | 0 refills | Status: DC | PRN

## 2022-03-26 NOTE — Telephone Encounter (Signed)
Patient calling in to request a lengthier Rx for Zofran as this time to be the only medication to help with her nausea and vomiting. Short term Rx refilled once. Please advise on long term Rx

## 2022-03-31 ENCOUNTER — Other Ambulatory Visit: Payer: Self-pay | Admitting: Licensed Practical Nurse

## 2022-03-31 DIAGNOSIS — O219 Vomiting of pregnancy, unspecified: Secondary | ICD-10-CM

## 2022-03-31 MED ORDER — ONDANSETRON 4 MG PO TBDP: 4.0000 mg | ORAL_TABLET | Freq: Three times a day (TID) | ORAL | 0 refills | Status: DC | PRN

## 2022-03-31 NOTE — Progress Notes (Signed)
Answering service called, pt has 1 dose of Zofran left, requesting refill as she continues to have nausea. Pt is able to drink and is keeping fluids and some food down. Refill sent to pharmacy on file Jannifer Hick   [facility]  03/31/22  12:33 PM

## 2022-04-02 DIAGNOSIS — N83202 Unspecified ovarian cyst, left side: Secondary | ICD-10-CM | POA: Diagnosis not present

## 2022-04-02 DIAGNOSIS — O3481 Maternal care for other abnormalities of pelvic organs, first trimester: Secondary | ICD-10-CM | POA: Diagnosis not present

## 2022-04-02 DIAGNOSIS — N83201 Unspecified ovarian cyst, right side: Secondary | ICD-10-CM | POA: Diagnosis not present

## 2022-04-02 DIAGNOSIS — A749 Chlamydial infection, unspecified: Secondary | ICD-10-CM | POA: Diagnosis not present

## 2022-04-02 DIAGNOSIS — Z3A09 9 weeks gestation of pregnancy: Secondary | ICD-10-CM | POA: Diagnosis not present

## 2022-04-02 DIAGNOSIS — O209 Hemorrhage in early pregnancy, unspecified: Secondary | ICD-10-CM | POA: Diagnosis not present

## 2022-04-03 DIAGNOSIS — N83202 Unspecified ovarian cyst, left side: Secondary | ICD-10-CM | POA: Diagnosis not present

## 2022-04-03 DIAGNOSIS — N83201 Unspecified ovarian cyst, right side: Secondary | ICD-10-CM | POA: Diagnosis not present

## 2022-04-03 DIAGNOSIS — Z3A09 9 weeks gestation of pregnancy: Secondary | ICD-10-CM | POA: Diagnosis not present

## 2022-04-03 DIAGNOSIS — O3481 Maternal care for other abnormalities of pelvic organs, first trimester: Secondary | ICD-10-CM | POA: Diagnosis not present

## 2022-04-03 DIAGNOSIS — O209 Hemorrhage in early pregnancy, unspecified: Secondary | ICD-10-CM | POA: Diagnosis not present

## 2022-04-16 ENCOUNTER — Telehealth: Payer: Self-pay | Admitting: Obstetrics and Gynecology

## 2022-04-16 ENCOUNTER — Other Ambulatory Visit: Payer: Medicaid Other

## 2022-04-16 NOTE — Telephone Encounter (Signed)
Reached out to pt to reschedule NOB labs that were scheduled on 11/27 at 9:20.  Could not leave a message, MB not set up.

## 2022-04-17 NOTE — Telephone Encounter (Signed)
Contacted pt to reschedule NOB labs that were scheduled on 04/16/22 at 9:20.  Pt was rescheduled for 11/29 at 11:00 for labs.

## 2022-04-18 ENCOUNTER — Other Ambulatory Visit: Payer: Medicaid Other

## 2022-04-18 ENCOUNTER — Other Ambulatory Visit: Payer: Self-pay

## 2022-04-18 DIAGNOSIS — Z3401 Encounter for supervision of normal first pregnancy, first trimester: Secondary | ICD-10-CM

## 2022-04-18 DIAGNOSIS — O219 Vomiting of pregnancy, unspecified: Secondary | ICD-10-CM

## 2022-04-18 MED ORDER — ONDANSETRON 4 MG PO TBDP: 4.0000 mg | ORAL_TABLET | Freq: Three times a day (TID) | ORAL | 0 refills | Status: AC | PRN

## 2022-04-19 LAB — HCV INTERPRETATION

## 2022-04-19 LAB — CBC/D/PLT+RPR+RH+ABO+RUBIGG...
Antibody Screen: NEGATIVE
Basophils Absolute: 0 10*3/uL (ref 0.0–0.2)
Basos: 0 %
EOS (ABSOLUTE): 0.1 10*3/uL (ref 0.0–0.4)
Eos: 1 %
HCV Ab: NONREACTIVE
HIV Screen 4th Generation wRfx: NONREACTIVE
Hematocrit: 40.6 % (ref 34.0–46.6)
Hemoglobin: 13.2 g/dL (ref 11.1–15.9)
Hepatitis B Surface Ag: NEGATIVE
Immature Grans (Abs): 0.1 10*3/uL (ref 0.0–0.1)
Immature Granulocytes: 1 %
Lymphocytes Absolute: 1.3 10*3/uL (ref 0.7–3.1)
Lymphs: 13 %
MCH: 28.3 pg (ref 26.6–33.0)
MCHC: 32.5 g/dL (ref 31.5–35.7)
MCV: 87 fL (ref 79–97)
Monocytes Absolute: 0.3 10*3/uL (ref 0.1–0.9)
Monocytes: 3 %
Neutrophils Absolute: 8.7 10*3/uL — ABNORMAL HIGH (ref 1.4–7.0)
Neutrophils: 82 %
Platelets: 378 10*3/uL (ref 150–450)
RBC: 4.67 x10E6/uL (ref 3.77–5.28)
RDW: 12.6 % (ref 11.7–15.4)
RPR Ser Ql: NONREACTIVE
Rh Factor: POSITIVE
Rubella Antibodies, IGG: 4.21 index (ref >0.99)
Varicella zoster IgG: 236 index (ref >165)
WBC: 10.5 10*3/uL (ref 3.4–10.8)

## 2022-04-23 ENCOUNTER — Telehealth: Payer: Self-pay

## 2022-04-23 DIAGNOSIS — O219 Vomiting of pregnancy, unspecified: Secondary | ICD-10-CM

## 2022-04-23 MED ORDER — DOXYLAMINE-PYRIDOXINE 10-10 MG PO TBEC: DELAYED_RELEASE_TABLET | ORAL | 0 refills | Status: AC

## 2022-04-23 NOTE — Telephone Encounter (Signed)
Pt needs her Diclegis Rx 10 mg tablets sent to [facility] in [place]. RF sent, pt aware. I called [facility] in [place] to cancel Rx there.

## 2022-04-24 ENCOUNTER — Telehealth: Payer: Self-pay | Admitting: Obstetrics and Gynecology

## 2022-04-24 ENCOUNTER — Encounter: Payer: Medicaid Other | Admitting: Obstetrics and Gynecology

## 2022-04-24 DIAGNOSIS — Z1379 Encounter for other screening for genetic and chromosomal anomalies: Secondary | ICD-10-CM

## 2022-04-24 DIAGNOSIS — Z124 Encounter for screening for malignant neoplasm of cervix: Secondary | ICD-10-CM

## 2022-04-24 DIAGNOSIS — Z3401 Encounter for supervision of normal first pregnancy, first trimester: Secondary | ICD-10-CM

## 2022-04-24 DIAGNOSIS — Z3A12 12 weeks gestation of pregnancy: Secondary | ICD-10-CM

## 2022-04-24 NOTE — Telephone Encounter (Signed)
Reached out to pt to reschedule New OB appt that was scheduled for 12/5 at 1:30 with Dr. Logan Bores.  Could not leave a message, mailbox not set up.

## 2022-04-25 ENCOUNTER — Encounter: Payer: Self-pay | Admitting: Obstetrics and Gynecology

## 2022-04-25 NOTE — Telephone Encounter (Signed)
Reached out to pt (2x) to reschedule New OB appt that was scheduled for 12/5 at 1:30 with Dr. Logan Bores.  Could not leave a message, mailbox not set up.  Will send a MyChart letter.

## 2022-04-30 DIAGNOSIS — O3680X0 Pregnancy with inconclusive fetal viability, not applicable or unspecified: Secondary | ICD-10-CM | POA: Diagnosis not present

## 2022-04-30 DIAGNOSIS — Z3A13 13 weeks gestation of pregnancy: Secondary | ICD-10-CM | POA: Diagnosis not present

## 2022-05-02 DIAGNOSIS — Z3401 Encounter for supervision of normal first pregnancy, first trimester: Secondary | ICD-10-CM | POA: Diagnosis not present

## 2022-06-20 DIAGNOSIS — Z3689 Encounter for other specified antenatal screening: Secondary | ICD-10-CM | POA: Diagnosis not present

## 2022-06-20 DIAGNOSIS — O358XX0 Maternal care for other (suspected) fetal abnormality and damage, not applicable or unspecified: Secondary | ICD-10-CM | POA: Diagnosis not present

## 2022-06-20 DIAGNOSIS — Z3A20 20 weeks gestation of pregnancy: Secondary | ICD-10-CM | POA: Diagnosis not present

## 2022-06-20 DIAGNOSIS — Q675 Congenital deformity of spine: Secondary | ICD-10-CM | POA: Diagnosis not present

## 2022-06-20 DIAGNOSIS — Z31430 Encounter of female for testing for genetic disease carrier status for procreative management: Secondary | ICD-10-CM | POA: Diagnosis not present

## 2022-08-01 DIAGNOSIS — Z3A26 26 weeks gestation of pregnancy: Secondary | ICD-10-CM | POA: Diagnosis not present

## 2022-08-01 DIAGNOSIS — Z87891 Personal history of nicotine dependence: Secondary | ICD-10-CM | POA: Diagnosis not present

## 2022-08-01 DIAGNOSIS — O4692 Antepartum hemorrhage, unspecified, second trimester: Secondary | ICD-10-CM | POA: Diagnosis not present

## 2022-08-02 DIAGNOSIS — Z362 Encounter for other antenatal screening follow-up: Secondary | ICD-10-CM | POA: Diagnosis not present

## 2022-08-02 DIAGNOSIS — Z3A26 26 weeks gestation of pregnancy: Secondary | ICD-10-CM | POA: Diagnosis not present

## 2022-08-02 DIAGNOSIS — O4692 Antepartum hemorrhage, unspecified, second trimester: Secondary | ICD-10-CM | POA: Diagnosis not present

## 2022-08-02 DIAGNOSIS — O321XX0 Maternal care for breech presentation, not applicable or unspecified: Secondary | ICD-10-CM | POA: Diagnosis not present

## 2022-08-03 DIAGNOSIS — Z3A26 26 weeks gestation of pregnancy: Secondary | ICD-10-CM | POA: Diagnosis not present

## 2022-08-03 DIAGNOSIS — O4692 Antepartum hemorrhage, unspecified, second trimester: Secondary | ICD-10-CM | POA: Diagnosis not present

## 2022-08-06 DIAGNOSIS — O99891 Other specified diseases and conditions complicating pregnancy: Secondary | ICD-10-CM | POA: Diagnosis not present

## 2022-08-06 DIAGNOSIS — R109 Unspecified abdominal pain: Secondary | ICD-10-CM | POA: Diagnosis not present

## 2022-08-06 DIAGNOSIS — Z3A26 26 weeks gestation of pregnancy: Secondary | ICD-10-CM | POA: Diagnosis not present

## 2022-08-06 DIAGNOSIS — O4692 Antepartum hemorrhage, unspecified, second trimester: Secondary | ICD-10-CM | POA: Diagnosis not present

## 2022-08-07 DIAGNOSIS — O321XX0 Maternal care for breech presentation, not applicable or unspecified: Secondary | ICD-10-CM | POA: Diagnosis not present

## 2022-08-07 DIAGNOSIS — O4692 Antepartum hemorrhage, unspecified, second trimester: Secondary | ICD-10-CM | POA: Diagnosis not present

## 2022-08-07 DIAGNOSIS — O4592 Premature separation of placenta, unspecified, second trimester: Secondary | ICD-10-CM | POA: Diagnosis not present

## 2022-08-07 DIAGNOSIS — Z3A27 27 weeks gestation of pregnancy: Secondary | ICD-10-CM | POA: Diagnosis not present

## 2022-08-08 DIAGNOSIS — Z3A27 27 weeks gestation of pregnancy: Secondary | ICD-10-CM | POA: Diagnosis not present

## 2022-08-08 DIAGNOSIS — O321XX0 Maternal care for breech presentation, not applicable or unspecified: Secondary | ICD-10-CM | POA: Diagnosis not present

## 2022-08-08 DIAGNOSIS — O4592 Premature separation of placenta, unspecified, second trimester: Secondary | ICD-10-CM | POA: Diagnosis not present

## 2022-08-09 DIAGNOSIS — O321XX0 Maternal care for breech presentation, not applicable or unspecified: Secondary | ICD-10-CM | POA: Diagnosis not present

## 2022-08-09 DIAGNOSIS — Z3A27 27 weeks gestation of pregnancy: Secondary | ICD-10-CM | POA: Diagnosis not present

## 2022-08-09 DIAGNOSIS — O4592 Premature separation of placenta, unspecified, second trimester: Secondary | ICD-10-CM | POA: Diagnosis not present

## 2022-08-10 DIAGNOSIS — O321XX0 Maternal care for breech presentation, not applicable or unspecified: Secondary | ICD-10-CM | POA: Diagnosis not present

## 2022-08-10 DIAGNOSIS — O4592 Premature separation of placenta, unspecified, second trimester: Secondary | ICD-10-CM | POA: Diagnosis not present

## 2022-08-10 DIAGNOSIS — O99322 Drug use complicating pregnancy, second trimester: Secondary | ICD-10-CM | POA: Diagnosis not present

## 2022-08-10 DIAGNOSIS — Z3A27 27 weeks gestation of pregnancy: Secondary | ICD-10-CM | POA: Diagnosis not present

## 2022-08-11 DIAGNOSIS — Z3A27 27 weeks gestation of pregnancy: Secondary | ICD-10-CM | POA: Diagnosis not present

## 2022-08-11 DIAGNOSIS — O4692 Antepartum hemorrhage, unspecified, second trimester: Secondary | ICD-10-CM | POA: Diagnosis not present

## 2022-08-31 DIAGNOSIS — O0993 Supervision of high risk pregnancy, unspecified, third trimester: Secondary | ICD-10-CM | POA: Diagnosis not present

## 2022-08-31 DIAGNOSIS — Z23 Encounter for immunization: Secondary | ICD-10-CM | POA: Diagnosis not present

## 2022-09-12 DIAGNOSIS — O358XX0 Maternal care for other (suspected) fetal abnormality and damage, not applicable or unspecified: Secondary | ICD-10-CM | POA: Diagnosis not present

## 2022-09-12 DIAGNOSIS — Z3A32 32 weeks gestation of pregnancy: Secondary | ICD-10-CM | POA: Diagnosis not present

## 2022-09-24 DIAGNOSIS — R112 Nausea with vomiting, unspecified: Secondary | ICD-10-CM | POA: Diagnosis not present

## 2022-09-24 DIAGNOSIS — Z3A33 33 weeks gestation of pregnancy: Secondary | ICD-10-CM | POA: Diagnosis not present

## 2022-09-24 DIAGNOSIS — O99891 Other specified diseases and conditions complicating pregnancy: Secondary | ICD-10-CM | POA: Diagnosis not present

## 2022-09-28 DIAGNOSIS — O210 Mild hyperemesis gravidarum: Secondary | ICD-10-CM | POA: Diagnosis not present

## 2022-10-15 DIAGNOSIS — O99344 Other mental disorders complicating childbirth: Secondary | ICD-10-CM | POA: Diagnosis not present

## 2022-10-15 DIAGNOSIS — Z3A36 36 weeks gestation of pregnancy: Secondary | ICD-10-CM | POA: Diagnosis not present

## 2022-10-15 DIAGNOSIS — O0993 Supervision of high risk pregnancy, unspecified, third trimester: Secondary | ICD-10-CM | POA: Diagnosis not present

## 2022-10-15 DIAGNOSIS — F419 Anxiety disorder, unspecified: Secondary | ICD-10-CM | POA: Diagnosis not present

## 2022-10-15 DIAGNOSIS — Z2821 Immunization not carried out because of patient refusal: Secondary | ICD-10-CM | POA: Diagnosis not present

## 2022-10-15 DIAGNOSIS — O4292 Full-term premature rupture of membranes, unspecified as to length of time between rupture and onset of labor: Secondary | ICD-10-CM | POA: Diagnosis not present

## 2022-10-15 DIAGNOSIS — Z87891 Personal history of nicotine dependence: Secondary | ICD-10-CM | POA: Diagnosis not present

## 2022-10-15 DIAGNOSIS — O9902 Anemia complicating childbirth: Secondary | ICD-10-CM | POA: Diagnosis not present

## 2022-10-16 DIAGNOSIS — Z3A37 37 weeks gestation of pregnancy: Secondary | ICD-10-CM | POA: Diagnosis not present

## 2022-10-16 DIAGNOSIS — O210 Mild hyperemesis gravidarum: Secondary | ICD-10-CM | POA: Diagnosis not present

## 2022-10-16 DIAGNOSIS — O9902 Anemia complicating childbirth: Secondary | ICD-10-CM | POA: Diagnosis not present

## 2022-12-06 DIAGNOSIS — F32A Depression, unspecified: Secondary | ICD-10-CM | POA: Diagnosis not present

## 2022-12-06 DIAGNOSIS — F419 Anxiety disorder, unspecified: Secondary | ICD-10-CM | POA: Diagnosis not present

## 2022-12-19 DIAGNOSIS — Z1331 Encounter for screening for depression: Secondary | ICD-10-CM | POA: Diagnosis not present

## 2023-01-31 DIAGNOSIS — F32A Depression, unspecified: Secondary | ICD-10-CM | POA: Diagnosis not present

## 2023-01-31 DIAGNOSIS — F419 Anxiety disorder, unspecified: Secondary | ICD-10-CM | POA: Diagnosis not present

## 2023-02-19 DIAGNOSIS — Z419 Encounter for procedure for purposes other than remedying health state, unspecified: Secondary | ICD-10-CM | POA: Diagnosis not present

## 2023-03-22 DIAGNOSIS — Z419 Encounter for procedure for purposes other than remedying health state, unspecified: Secondary | ICD-10-CM | POA: Diagnosis not present

## 2023-04-03 DIAGNOSIS — F4310 Post-traumatic stress disorder, unspecified: Secondary | ICD-10-CM | POA: Diagnosis not present

## 2023-04-03 DIAGNOSIS — F419 Anxiety disorder, unspecified: Secondary | ICD-10-CM | POA: Diagnosis not present

## 2023-04-03 DIAGNOSIS — F32A Depression, unspecified: Secondary | ICD-10-CM | POA: Diagnosis not present

## 2023-04-03 DIAGNOSIS — Z62810 Personal history of physical and sexual abuse in childhood: Secondary | ICD-10-CM | POA: Diagnosis not present

## 2023-04-16 DIAGNOSIS — F419 Anxiety disorder, unspecified: Secondary | ICD-10-CM | POA: Diagnosis not present

## 2023-04-16 DIAGNOSIS — F32A Depression, unspecified: Secondary | ICD-10-CM | POA: Diagnosis not present

## 2023-04-17 DIAGNOSIS — F419 Anxiety disorder, unspecified: Secondary | ICD-10-CM | POA: Diagnosis not present

## 2023-04-17 DIAGNOSIS — F32A Depression, unspecified: Secondary | ICD-10-CM | POA: Diagnosis not present

## 2023-04-21 DIAGNOSIS — Z419 Encounter for procedure for purposes other than remedying health state, unspecified: Secondary | ICD-10-CM | POA: Diagnosis not present

## 2023-05-21 DIAGNOSIS — F32A Depression, unspecified: Secondary | ICD-10-CM | POA: Diagnosis not present

## 2023-05-21 DIAGNOSIS — F419 Anxiety disorder, unspecified: Secondary | ICD-10-CM | POA: Diagnosis not present

## 2023-05-22 DIAGNOSIS — Z419 Encounter for procedure for purposes other than remedying health state, unspecified: Secondary | ICD-10-CM | POA: Diagnosis not present

## 2023-06-13 DIAGNOSIS — F419 Anxiety disorder, unspecified: Secondary | ICD-10-CM | POA: Diagnosis not present

## 2023-06-13 DIAGNOSIS — F32A Depression, unspecified: Secondary | ICD-10-CM | POA: Diagnosis not present

## 2023-06-21 DIAGNOSIS — F4310 Post-traumatic stress disorder, unspecified: Secondary | ICD-10-CM | POA: Diagnosis not present

## 2023-06-21 DIAGNOSIS — F3132 Bipolar disorder, current episode depressed, moderate: Secondary | ICD-10-CM | POA: Diagnosis not present

## 2023-06-21 DIAGNOSIS — F410 Panic disorder [episodic paroxysmal anxiety] without agoraphobia: Secondary | ICD-10-CM | POA: Diagnosis not present

## 2023-06-22 DIAGNOSIS — Z419 Encounter for procedure for purposes other than remedying health state, unspecified: Secondary | ICD-10-CM | POA: Diagnosis not present

## 2023-06-25 DIAGNOSIS — F32A Depression, unspecified: Secondary | ICD-10-CM | POA: Diagnosis not present

## 2023-06-25 DIAGNOSIS — F419 Anxiety disorder, unspecified: Secondary | ICD-10-CM | POA: Diagnosis not present

## 2023-06-27 DIAGNOSIS — F419 Anxiety disorder, unspecified: Secondary | ICD-10-CM | POA: Diagnosis not present

## 2023-06-27 DIAGNOSIS — F32A Depression, unspecified: Secondary | ICD-10-CM | POA: Diagnosis not present

## 2023-06-28 DIAGNOSIS — F4310 Post-traumatic stress disorder, unspecified: Secondary | ICD-10-CM | POA: Diagnosis not present

## 2023-07-09 DIAGNOSIS — F4310 Post-traumatic stress disorder, unspecified: Secondary | ICD-10-CM | POA: Diagnosis not present

## 2023-07-10 DIAGNOSIS — F4310 Post-traumatic stress disorder, unspecified: Secondary | ICD-10-CM | POA: Diagnosis not present

## 2023-07-10 DIAGNOSIS — F4010 Social phobia, unspecified: Secondary | ICD-10-CM | POA: Diagnosis not present

## 2023-07-10 DIAGNOSIS — F410 Panic disorder [episodic paroxysmal anxiety] without agoraphobia: Secondary | ICD-10-CM | POA: Diagnosis not present

## 2023-07-11 DIAGNOSIS — F4310 Post-traumatic stress disorder, unspecified: Secondary | ICD-10-CM | POA: Diagnosis not present

## 2023-07-17 DIAGNOSIS — F410 Panic disorder [episodic paroxysmal anxiety] without agoraphobia: Secondary | ICD-10-CM | POA: Diagnosis not present

## 2023-07-17 DIAGNOSIS — F4010 Social phobia, unspecified: Secondary | ICD-10-CM | POA: Diagnosis not present

## 2023-07-17 DIAGNOSIS — F4310 Post-traumatic stress disorder, unspecified: Secondary | ICD-10-CM | POA: Diagnosis not present

## 2023-07-20 DIAGNOSIS — Z419 Encounter for procedure for purposes other than remedying health state, unspecified: Secondary | ICD-10-CM | POA: Diagnosis not present

## 2023-07-24 DIAGNOSIS — F410 Panic disorder [episodic paroxysmal anxiety] without agoraphobia: Secondary | ICD-10-CM | POA: Diagnosis not present

## 2023-07-24 DIAGNOSIS — F4310 Post-traumatic stress disorder, unspecified: Secondary | ICD-10-CM | POA: Diagnosis not present

## 2023-07-24 DIAGNOSIS — F4010 Social phobia, unspecified: Secondary | ICD-10-CM | POA: Diagnosis not present

## 2023-08-02 DIAGNOSIS — F4310 Post-traumatic stress disorder, unspecified: Secondary | ICD-10-CM | POA: Diagnosis not present

## 2023-08-02 DIAGNOSIS — F4010 Social phobia, unspecified: Secondary | ICD-10-CM | POA: Diagnosis not present

## 2023-08-02 DIAGNOSIS — F410 Panic disorder [episodic paroxysmal anxiety] without agoraphobia: Secondary | ICD-10-CM | POA: Diagnosis not present

## 2023-08-07 DIAGNOSIS — F4310 Post-traumatic stress disorder, unspecified: Secondary | ICD-10-CM | POA: Diagnosis not present

## 2023-08-07 DIAGNOSIS — F4010 Social phobia, unspecified: Secondary | ICD-10-CM | POA: Diagnosis not present

## 2023-08-07 DIAGNOSIS — F410 Panic disorder [episodic paroxysmal anxiety] without agoraphobia: Secondary | ICD-10-CM | POA: Diagnosis not present

## 2023-08-14 DIAGNOSIS — F410 Panic disorder [episodic paroxysmal anxiety] without agoraphobia: Secondary | ICD-10-CM | POA: Diagnosis not present

## 2023-08-14 DIAGNOSIS — F4010 Social phobia, unspecified: Secondary | ICD-10-CM | POA: Diagnosis not present

## 2023-08-14 DIAGNOSIS — F4310 Post-traumatic stress disorder, unspecified: Secondary | ICD-10-CM | POA: Diagnosis not present

## 2023-08-20 DIAGNOSIS — F410 Panic disorder [episodic paroxysmal anxiety] without agoraphobia: Secondary | ICD-10-CM | POA: Diagnosis not present

## 2023-08-20 DIAGNOSIS — F3481 Disruptive mood dysregulation disorder: Secondary | ICD-10-CM | POA: Diagnosis not present

## 2023-08-20 DIAGNOSIS — F32A Depression, unspecified: Secondary | ICD-10-CM | POA: Diagnosis not present

## 2023-08-20 DIAGNOSIS — F4310 Post-traumatic stress disorder, unspecified: Secondary | ICD-10-CM | POA: Diagnosis not present

## 2023-08-20 DIAGNOSIS — F53 Postpartum depression: Secondary | ICD-10-CM | POA: Diagnosis not present

## 2023-08-21 DIAGNOSIS — F410 Panic disorder [episodic paroxysmal anxiety] without agoraphobia: Secondary | ICD-10-CM | POA: Diagnosis not present

## 2023-08-21 DIAGNOSIS — F4310 Post-traumatic stress disorder, unspecified: Secondary | ICD-10-CM | POA: Diagnosis not present

## 2023-08-21 DIAGNOSIS — F4010 Social phobia, unspecified: Secondary | ICD-10-CM | POA: Diagnosis not present

## 2023-08-27 DIAGNOSIS — F410 Panic disorder [episodic paroxysmal anxiety] without agoraphobia: Secondary | ICD-10-CM | POA: Diagnosis not present

## 2023-08-27 DIAGNOSIS — F4010 Social phobia, unspecified: Secondary | ICD-10-CM | POA: Diagnosis not present

## 2023-08-27 DIAGNOSIS — F4310 Post-traumatic stress disorder, unspecified: Secondary | ICD-10-CM | POA: Diagnosis not present

## 2023-08-28 DIAGNOSIS — F4010 Social phobia, unspecified: Secondary | ICD-10-CM | POA: Diagnosis not present

## 2023-08-28 DIAGNOSIS — F4310 Post-traumatic stress disorder, unspecified: Secondary | ICD-10-CM | POA: Diagnosis not present

## 2023-08-28 DIAGNOSIS — F410 Panic disorder [episodic paroxysmal anxiety] without agoraphobia: Secondary | ICD-10-CM | POA: Diagnosis not present

## 2023-08-31 DIAGNOSIS — Z419 Encounter for procedure for purposes other than remedying health state, unspecified: Secondary | ICD-10-CM | POA: Diagnosis not present

## 2023-09-03 DIAGNOSIS — F4310 Post-traumatic stress disorder, unspecified: Secondary | ICD-10-CM | POA: Diagnosis not present

## 2023-09-03 DIAGNOSIS — F410 Panic disorder [episodic paroxysmal anxiety] without agoraphobia: Secondary | ICD-10-CM | POA: Diagnosis not present

## 2023-09-03 DIAGNOSIS — F4010 Social phobia, unspecified: Secondary | ICD-10-CM | POA: Diagnosis not present

## 2023-09-04 DIAGNOSIS — F4010 Social phobia, unspecified: Secondary | ICD-10-CM | POA: Diagnosis not present

## 2023-09-04 DIAGNOSIS — F410 Panic disorder [episodic paroxysmal anxiety] without agoraphobia: Secondary | ICD-10-CM | POA: Diagnosis not present

## 2023-09-04 DIAGNOSIS — F4310 Post-traumatic stress disorder, unspecified: Secondary | ICD-10-CM | POA: Diagnosis not present

## 2023-09-10 DIAGNOSIS — F410 Panic disorder [episodic paroxysmal anxiety] without agoraphobia: Secondary | ICD-10-CM | POA: Diagnosis not present

## 2023-09-10 DIAGNOSIS — F4010 Social phobia, unspecified: Secondary | ICD-10-CM | POA: Diagnosis not present

## 2023-09-10 DIAGNOSIS — F4310 Post-traumatic stress disorder, unspecified: Secondary | ICD-10-CM | POA: Diagnosis not present

## 2023-09-11 DIAGNOSIS — F410 Panic disorder [episodic paroxysmal anxiety] without agoraphobia: Secondary | ICD-10-CM | POA: Diagnosis not present

## 2023-09-11 DIAGNOSIS — F4310 Post-traumatic stress disorder, unspecified: Secondary | ICD-10-CM | POA: Diagnosis not present

## 2023-09-11 DIAGNOSIS — F4010 Social phobia, unspecified: Secondary | ICD-10-CM | POA: Diagnosis not present

## 2023-09-17 DIAGNOSIS — F410 Panic disorder [episodic paroxysmal anxiety] without agoraphobia: Secondary | ICD-10-CM | POA: Diagnosis not present

## 2023-09-17 DIAGNOSIS — F4010 Social phobia, unspecified: Secondary | ICD-10-CM | POA: Diagnosis not present

## 2023-09-17 DIAGNOSIS — F4310 Post-traumatic stress disorder, unspecified: Secondary | ICD-10-CM | POA: Diagnosis not present

## 2023-09-18 DIAGNOSIS — F410 Panic disorder [episodic paroxysmal anxiety] without agoraphobia: Secondary | ICD-10-CM | POA: Diagnosis not present

## 2023-09-18 DIAGNOSIS — F4310 Post-traumatic stress disorder, unspecified: Secondary | ICD-10-CM | POA: Diagnosis not present

## 2023-09-18 DIAGNOSIS — F4010 Social phobia, unspecified: Secondary | ICD-10-CM | POA: Diagnosis not present

## 2023-09-24 DIAGNOSIS — F410 Panic disorder [episodic paroxysmal anxiety] without agoraphobia: Secondary | ICD-10-CM | POA: Diagnosis not present

## 2023-09-24 DIAGNOSIS — F4010 Social phobia, unspecified: Secondary | ICD-10-CM | POA: Diagnosis not present

## 2023-09-24 DIAGNOSIS — F4310 Post-traumatic stress disorder, unspecified: Secondary | ICD-10-CM | POA: Diagnosis not present

## 2023-09-30 DIAGNOSIS — Z419 Encounter for procedure for purposes other than remedying health state, unspecified: Secondary | ICD-10-CM | POA: Diagnosis not present

## 2023-10-01 DIAGNOSIS — F4310 Post-traumatic stress disorder, unspecified: Secondary | ICD-10-CM | POA: Diagnosis not present

## 2023-10-01 DIAGNOSIS — F410 Panic disorder [episodic paroxysmal anxiety] without agoraphobia: Secondary | ICD-10-CM | POA: Diagnosis not present

## 2023-10-01 DIAGNOSIS — F4010 Social phobia, unspecified: Secondary | ICD-10-CM | POA: Diagnosis not present

## 2023-10-02 DIAGNOSIS — F410 Panic disorder [episodic paroxysmal anxiety] without agoraphobia: Secondary | ICD-10-CM | POA: Diagnosis not present

## 2023-10-02 DIAGNOSIS — F4310 Post-traumatic stress disorder, unspecified: Secondary | ICD-10-CM | POA: Diagnosis not present

## 2023-10-02 DIAGNOSIS — F4010 Social phobia, unspecified: Secondary | ICD-10-CM | POA: Diagnosis not present

## 2023-10-08 DIAGNOSIS — F4010 Social phobia, unspecified: Secondary | ICD-10-CM | POA: Diagnosis not present

## 2023-10-08 DIAGNOSIS — F410 Panic disorder [episodic paroxysmal anxiety] without agoraphobia: Secondary | ICD-10-CM | POA: Diagnosis not present

## 2023-10-08 DIAGNOSIS — F4310 Post-traumatic stress disorder, unspecified: Secondary | ICD-10-CM | POA: Diagnosis not present

## 2023-10-09 DIAGNOSIS — F4010 Social phobia, unspecified: Secondary | ICD-10-CM | POA: Diagnosis not present

## 2023-10-09 DIAGNOSIS — F4310 Post-traumatic stress disorder, unspecified: Secondary | ICD-10-CM | POA: Diagnosis not present

## 2023-10-09 DIAGNOSIS — F410 Panic disorder [episodic paroxysmal anxiety] without agoraphobia: Secondary | ICD-10-CM | POA: Diagnosis not present

## 2023-10-15 DIAGNOSIS — F32A Depression, unspecified: Secondary | ICD-10-CM | POA: Diagnosis not present

## 2023-10-15 DIAGNOSIS — F4010 Social phobia, unspecified: Secondary | ICD-10-CM | POA: Diagnosis not present

## 2023-10-15 DIAGNOSIS — F419 Anxiety disorder, unspecified: Secondary | ICD-10-CM | POA: Diagnosis not present

## 2023-10-15 DIAGNOSIS — F410 Panic disorder [episodic paroxysmal anxiety] without agoraphobia: Secondary | ICD-10-CM | POA: Diagnosis not present

## 2023-10-15 DIAGNOSIS — F4310 Post-traumatic stress disorder, unspecified: Secondary | ICD-10-CM | POA: Diagnosis not present

## 2023-10-17 DIAGNOSIS — F4010 Social phobia, unspecified: Secondary | ICD-10-CM | POA: Diagnosis not present

## 2023-10-17 DIAGNOSIS — F4310 Post-traumatic stress disorder, unspecified: Secondary | ICD-10-CM | POA: Diagnosis not present

## 2023-10-17 DIAGNOSIS — F410 Panic disorder [episodic paroxysmal anxiety] without agoraphobia: Secondary | ICD-10-CM | POA: Diagnosis not present

## 2023-10-22 DIAGNOSIS — F4010 Social phobia, unspecified: Secondary | ICD-10-CM | POA: Diagnosis not present

## 2023-10-22 DIAGNOSIS — F410 Panic disorder [episodic paroxysmal anxiety] without agoraphobia: Secondary | ICD-10-CM | POA: Diagnosis not present

## 2023-10-22 DIAGNOSIS — F4310 Post-traumatic stress disorder, unspecified: Secondary | ICD-10-CM | POA: Diagnosis not present

## 2023-10-23 DIAGNOSIS — F410 Panic disorder [episodic paroxysmal anxiety] without agoraphobia: Secondary | ICD-10-CM | POA: Diagnosis not present

## 2023-10-23 DIAGNOSIS — F4010 Social phobia, unspecified: Secondary | ICD-10-CM | POA: Diagnosis not present

## 2023-10-23 DIAGNOSIS — F4310 Post-traumatic stress disorder, unspecified: Secondary | ICD-10-CM | POA: Diagnosis not present

## 2023-10-31 DIAGNOSIS — Z419 Encounter for procedure for purposes other than remedying health state, unspecified: Secondary | ICD-10-CM | POA: Diagnosis not present

## 2023-11-05 DIAGNOSIS — F4010 Social phobia, unspecified: Secondary | ICD-10-CM | POA: Diagnosis not present

## 2023-11-05 DIAGNOSIS — F410 Panic disorder [episodic paroxysmal anxiety] without agoraphobia: Secondary | ICD-10-CM | POA: Diagnosis not present

## 2023-11-05 DIAGNOSIS — F4310 Post-traumatic stress disorder, unspecified: Secondary | ICD-10-CM | POA: Diagnosis not present

## 2023-11-06 DIAGNOSIS — F410 Panic disorder [episodic paroxysmal anxiety] without agoraphobia: Secondary | ICD-10-CM | POA: Diagnosis not present

## 2023-11-06 DIAGNOSIS — F4010 Social phobia, unspecified: Secondary | ICD-10-CM | POA: Diagnosis not present

## 2023-11-06 DIAGNOSIS — F4310 Post-traumatic stress disorder, unspecified: Secondary | ICD-10-CM | POA: Diagnosis not present

## 2023-11-08 DIAGNOSIS — F4310 Post-traumatic stress disorder, unspecified: Secondary | ICD-10-CM | POA: Diagnosis not present

## 2023-11-08 DIAGNOSIS — F419 Anxiety disorder, unspecified: Secondary | ICD-10-CM | POA: Diagnosis not present

## 2023-11-08 DIAGNOSIS — F32A Depression, unspecified: Secondary | ICD-10-CM | POA: Diagnosis not present

## 2023-11-08 DIAGNOSIS — F4010 Social phobia, unspecified: Secondary | ICD-10-CM | POA: Diagnosis not present

## 2023-11-12 DIAGNOSIS — F410 Panic disorder [episodic paroxysmal anxiety] without agoraphobia: Secondary | ICD-10-CM | POA: Diagnosis not present

## 2023-11-12 DIAGNOSIS — F4010 Social phobia, unspecified: Secondary | ICD-10-CM | POA: Diagnosis not present

## 2023-11-12 DIAGNOSIS — F4310 Post-traumatic stress disorder, unspecified: Secondary | ICD-10-CM | POA: Diagnosis not present

## 2023-11-13 DIAGNOSIS — F4310 Post-traumatic stress disorder, unspecified: Secondary | ICD-10-CM | POA: Diagnosis not present

## 2023-11-13 DIAGNOSIS — F4010 Social phobia, unspecified: Secondary | ICD-10-CM | POA: Diagnosis not present

## 2023-11-13 DIAGNOSIS — F410 Panic disorder [episodic paroxysmal anxiety] without agoraphobia: Secondary | ICD-10-CM | POA: Diagnosis not present

## 2023-11-26 DIAGNOSIS — F410 Panic disorder [episodic paroxysmal anxiety] without agoraphobia: Secondary | ICD-10-CM | POA: Diagnosis not present

## 2023-11-26 DIAGNOSIS — F4010 Social phobia, unspecified: Secondary | ICD-10-CM | POA: Diagnosis not present

## 2023-11-26 DIAGNOSIS — F4310 Post-traumatic stress disorder, unspecified: Secondary | ICD-10-CM | POA: Diagnosis not present

## 2023-11-27 DIAGNOSIS — F4310 Post-traumatic stress disorder, unspecified: Secondary | ICD-10-CM | POA: Diagnosis not present

## 2023-11-27 DIAGNOSIS — F410 Panic disorder [episodic paroxysmal anxiety] without agoraphobia: Secondary | ICD-10-CM | POA: Diagnosis not present

## 2023-11-27 DIAGNOSIS — F4010 Social phobia, unspecified: Secondary | ICD-10-CM | POA: Diagnosis not present

## 2023-12-03 DIAGNOSIS — F4010 Social phobia, unspecified: Secondary | ICD-10-CM | POA: Diagnosis not present

## 2023-12-03 DIAGNOSIS — F4310 Post-traumatic stress disorder, unspecified: Secondary | ICD-10-CM | POA: Diagnosis not present

## 2023-12-03 DIAGNOSIS — F410 Panic disorder [episodic paroxysmal anxiety] without agoraphobia: Secondary | ICD-10-CM | POA: Diagnosis not present

## 2023-12-10 DIAGNOSIS — F4310 Post-traumatic stress disorder, unspecified: Secondary | ICD-10-CM | POA: Diagnosis not present

## 2023-12-10 DIAGNOSIS — F410 Panic disorder [episodic paroxysmal anxiety] without agoraphobia: Secondary | ICD-10-CM | POA: Diagnosis not present

## 2023-12-10 DIAGNOSIS — F4010 Social phobia, unspecified: Secondary | ICD-10-CM | POA: Diagnosis not present

## 2023-12-19 DIAGNOSIS — Z30011 Encounter for initial prescription of contraceptive pills: Secondary | ICD-10-CM | POA: Diagnosis not present

## 2023-12-20 DIAGNOSIS — F4310 Post-traumatic stress disorder, unspecified: Secondary | ICD-10-CM | POA: Diagnosis not present

## 2023-12-20 DIAGNOSIS — F4010 Social phobia, unspecified: Secondary | ICD-10-CM | POA: Diagnosis not present

## 2023-12-20 DIAGNOSIS — R4587 Impulsiveness: Secondary | ICD-10-CM | POA: Diagnosis not present

## 2023-12-20 DIAGNOSIS — F410 Panic disorder [episodic paroxysmal anxiety] without agoraphobia: Secondary | ICD-10-CM | POA: Diagnosis not present

## 2023-12-31 DIAGNOSIS — F4010 Social phobia, unspecified: Secondary | ICD-10-CM | POA: Diagnosis not present

## 2023-12-31 DIAGNOSIS — F410 Panic disorder [episodic paroxysmal anxiety] without agoraphobia: Secondary | ICD-10-CM | POA: Diagnosis not present

## 2023-12-31 DIAGNOSIS — F4310 Post-traumatic stress disorder, unspecified: Secondary | ICD-10-CM | POA: Diagnosis not present

## 2024-01-07 DIAGNOSIS — F410 Panic disorder [episodic paroxysmal anxiety] without agoraphobia: Secondary | ICD-10-CM | POA: Diagnosis not present

## 2024-01-07 DIAGNOSIS — F4310 Post-traumatic stress disorder, unspecified: Secondary | ICD-10-CM | POA: Diagnosis not present

## 2024-01-07 DIAGNOSIS — F4010 Social phobia, unspecified: Secondary | ICD-10-CM | POA: Diagnosis not present

## 2024-01-14 DIAGNOSIS — F4310 Post-traumatic stress disorder, unspecified: Secondary | ICD-10-CM | POA: Diagnosis not present

## 2024-01-14 DIAGNOSIS — F410 Panic disorder [episodic paroxysmal anxiety] without agoraphobia: Secondary | ICD-10-CM | POA: Diagnosis not present

## 2024-01-14 DIAGNOSIS — F4010 Social phobia, unspecified: Secondary | ICD-10-CM | POA: Diagnosis not present

## 2024-01-21 DIAGNOSIS — F4310 Post-traumatic stress disorder, unspecified: Secondary | ICD-10-CM | POA: Diagnosis not present

## 2024-01-21 DIAGNOSIS — F410 Panic disorder [episodic paroxysmal anxiety] without agoraphobia: Secondary | ICD-10-CM | POA: Diagnosis not present

## 2024-01-21 DIAGNOSIS — F4010 Social phobia, unspecified: Secondary | ICD-10-CM | POA: Diagnosis not present

## 2024-01-28 DIAGNOSIS — F4010 Social phobia, unspecified: Secondary | ICD-10-CM | POA: Diagnosis not present

## 2024-01-28 DIAGNOSIS — F4310 Post-traumatic stress disorder, unspecified: Secondary | ICD-10-CM | POA: Diagnosis not present

## 2024-01-28 DIAGNOSIS — F410 Panic disorder [episodic paroxysmal anxiety] without agoraphobia: Secondary | ICD-10-CM | POA: Diagnosis not present

## 2024-01-31 DIAGNOSIS — Z419 Encounter for procedure for purposes other than remedying health state, unspecified: Secondary | ICD-10-CM | POA: Diagnosis not present

## 2024-02-04 DIAGNOSIS — F410 Panic disorder [episodic paroxysmal anxiety] without agoraphobia: Secondary | ICD-10-CM | POA: Diagnosis not present

## 2024-02-04 DIAGNOSIS — F4310 Post-traumatic stress disorder, unspecified: Secondary | ICD-10-CM | POA: Diagnosis not present

## 2024-02-04 DIAGNOSIS — F4010 Social phobia, unspecified: Secondary | ICD-10-CM | POA: Diagnosis not present

## 2024-02-05 DIAGNOSIS — Z793 Long term (current) use of hormonal contraceptives: Secondary | ICD-10-CM | POA: Diagnosis not present

## 2024-02-05 DIAGNOSIS — F419 Anxiety disorder, unspecified: Secondary | ICD-10-CM | POA: Diagnosis not present

## 2024-02-05 DIAGNOSIS — Z87891 Personal history of nicotine dependence: Secondary | ICD-10-CM | POA: Diagnosis not present

## 2024-02-05 DIAGNOSIS — F32A Depression, unspecified: Secondary | ICD-10-CM | POA: Diagnosis not present

## 2024-02-05 DIAGNOSIS — F4310 Post-traumatic stress disorder, unspecified: Secondary | ICD-10-CM | POA: Diagnosis not present

## 2024-02-11 DIAGNOSIS — F4310 Post-traumatic stress disorder, unspecified: Secondary | ICD-10-CM | POA: Diagnosis not present

## 2024-02-11 DIAGNOSIS — F410 Panic disorder [episodic paroxysmal anxiety] without agoraphobia: Secondary | ICD-10-CM | POA: Diagnosis not present

## 2024-02-11 DIAGNOSIS — F4010 Social phobia, unspecified: Secondary | ICD-10-CM | POA: Diagnosis not present

## 2024-02-18 DIAGNOSIS — F4010 Social phobia, unspecified: Secondary | ICD-10-CM | POA: Diagnosis not present

## 2024-02-18 DIAGNOSIS — F410 Panic disorder [episodic paroxysmal anxiety] without agoraphobia: Secondary | ICD-10-CM | POA: Diagnosis not present

## 2024-02-18 DIAGNOSIS — F4310 Post-traumatic stress disorder, unspecified: Secondary | ICD-10-CM | POA: Diagnosis not present

## 2024-02-25 DIAGNOSIS — F410 Panic disorder [episodic paroxysmal anxiety] without agoraphobia: Secondary | ICD-10-CM | POA: Diagnosis not present

## 2024-02-25 DIAGNOSIS — F4310 Post-traumatic stress disorder, unspecified: Secondary | ICD-10-CM | POA: Diagnosis not present

## 2024-02-25 DIAGNOSIS — F4010 Social phobia, unspecified: Secondary | ICD-10-CM | POA: Diagnosis not present

## 2024-03-10 DIAGNOSIS — F4310 Post-traumatic stress disorder, unspecified: Secondary | ICD-10-CM | POA: Diagnosis not present

## 2024-03-10 DIAGNOSIS — F4010 Social phobia, unspecified: Secondary | ICD-10-CM | POA: Diagnosis not present

## 2024-03-10 DIAGNOSIS — F410 Panic disorder [episodic paroxysmal anxiety] without agoraphobia: Secondary | ICD-10-CM | POA: Diagnosis not present

## 2024-03-13 DIAGNOSIS — F4010 Social phobia, unspecified: Secondary | ICD-10-CM | POA: Diagnosis not present

## 2024-03-13 DIAGNOSIS — F4310 Post-traumatic stress disorder, unspecified: Secondary | ICD-10-CM | POA: Diagnosis not present

## 2024-03-13 DIAGNOSIS — F410 Panic disorder [episodic paroxysmal anxiety] without agoraphobia: Secondary | ICD-10-CM | POA: Diagnosis not present

## 2024-03-16 DIAGNOSIS — N939 Abnormal uterine and vaginal bleeding, unspecified: Secondary | ICD-10-CM | POA: Diagnosis not present

## 2024-03-17 DIAGNOSIS — F410 Panic disorder [episodic paroxysmal anxiety] without agoraphobia: Secondary | ICD-10-CM | POA: Diagnosis not present

## 2024-03-17 DIAGNOSIS — F4310 Post-traumatic stress disorder, unspecified: Secondary | ICD-10-CM | POA: Diagnosis not present

## 2024-03-17 DIAGNOSIS — F4010 Social phobia, unspecified: Secondary | ICD-10-CM | POA: Diagnosis not present

## 2024-03-31 DIAGNOSIS — F410 Panic disorder [episodic paroxysmal anxiety] without agoraphobia: Secondary | ICD-10-CM | POA: Diagnosis not present

## 2024-03-31 DIAGNOSIS — F4310 Post-traumatic stress disorder, unspecified: Secondary | ICD-10-CM | POA: Diagnosis not present

## 2024-03-31 DIAGNOSIS — F4010 Social phobia, unspecified: Secondary | ICD-10-CM | POA: Diagnosis not present

## 2024-04-01 DIAGNOSIS — Z419 Encounter for procedure for purposes other than remedying health state, unspecified: Secondary | ICD-10-CM | POA: Diagnosis not present

## 2024-04-07 DIAGNOSIS — F410 Panic disorder [episodic paroxysmal anxiety] without agoraphobia: Secondary | ICD-10-CM | POA: Diagnosis not present

## 2024-04-07 DIAGNOSIS — F4010 Social phobia, unspecified: Secondary | ICD-10-CM | POA: Diagnosis not present

## 2024-04-07 DIAGNOSIS — F4310 Post-traumatic stress disorder, unspecified: Secondary | ICD-10-CM | POA: Diagnosis not present

## 2024-04-10 DIAGNOSIS — F4010 Social phobia, unspecified: Secondary | ICD-10-CM | POA: Diagnosis not present

## 2024-04-10 DIAGNOSIS — R4587 Impulsiveness: Secondary | ICD-10-CM | POA: Diagnosis not present

## 2024-04-10 DIAGNOSIS — F410 Panic disorder [episodic paroxysmal anxiety] without agoraphobia: Secondary | ICD-10-CM | POA: Diagnosis not present

## 2024-04-10 DIAGNOSIS — F419 Anxiety disorder, unspecified: Secondary | ICD-10-CM | POA: Diagnosis not present

## 2024-04-10 DIAGNOSIS — F4310 Post-traumatic stress disorder, unspecified: Secondary | ICD-10-CM | POA: Diagnosis not present

## 2024-04-10 DIAGNOSIS — F32A Depression, unspecified: Secondary | ICD-10-CM | POA: Diagnosis not present

## 2024-04-14 DIAGNOSIS — F4310 Post-traumatic stress disorder, unspecified: Secondary | ICD-10-CM | POA: Diagnosis not present

## 2024-04-14 DIAGNOSIS — F4010 Social phobia, unspecified: Secondary | ICD-10-CM | POA: Diagnosis not present

## 2024-04-14 DIAGNOSIS — F410 Panic disorder [episodic paroxysmal anxiety] without agoraphobia: Secondary | ICD-10-CM | POA: Diagnosis not present
